# Patient Record
Sex: Male | Born: 1940 | Race: White | Hispanic: No | Smoking: Former smoker
Health system: Southern US, Community
[De-identification: ages and names within clinical notes are randomized; demographics above are authoritative.]

## PROBLEM LIST (undated history)

## (undated) DIAGNOSIS — R112 Nausea with vomiting, unspecified: Secondary | ICD-10-CM

## (undated) DIAGNOSIS — E119 Type 2 diabetes mellitus without complications: Secondary | ICD-10-CM

## (undated) DIAGNOSIS — Z87442 Personal history of urinary calculi: Secondary | ICD-10-CM

## (undated) DIAGNOSIS — F32A Depression, unspecified: Secondary | ICD-10-CM

## (undated) DIAGNOSIS — E0590 Thyrotoxicosis, unspecified without thyrotoxic crisis or storm: Secondary | ICD-10-CM

## (undated) DIAGNOSIS — E785 Hyperlipidemia, unspecified: Secondary | ICD-10-CM

## (undated) DIAGNOSIS — I10 Essential (primary) hypertension: Secondary | ICD-10-CM

## (undated) DIAGNOSIS — E039 Hypothyroidism, unspecified: Secondary | ICD-10-CM

## (undated) DIAGNOSIS — Z9889 Other specified postprocedural states: Secondary | ICD-10-CM

## (undated) DIAGNOSIS — F419 Anxiety disorder, unspecified: Secondary | ICD-10-CM

## (undated) DIAGNOSIS — H903 Sensorineural hearing loss, bilateral: Secondary | ICD-10-CM

## (undated) DIAGNOSIS — C801 Malignant (primary) neoplasm, unspecified: Secondary | ICD-10-CM

## (undated) HISTORY — DX: Essential (primary) hypertension: I10

## (undated) HISTORY — PX: MANDIBLE FRACTURE SURGERY: SHX706

## (undated) HISTORY — DX: Hyperlipidemia, unspecified: E78.5

---

## 2011-10-14 HISTORY — PX: HERNIA REPAIR: SHX51

## 2012-06-13 DEATH — deceased

## 2012-07-15 ENCOUNTER — Ambulatory Visit: Payer: Self-pay | Admitting: Surgery

## 2012-07-23 ENCOUNTER — Ambulatory Visit: Payer: Self-pay | Admitting: Surgery

## 2014-05-19 DIAGNOSIS — F418 Other specified anxiety disorders: Secondary | ICD-10-CM | POA: Insufficient documentation

## 2014-05-19 DIAGNOSIS — E7800 Pure hypercholesterolemia, unspecified: Secondary | ICD-10-CM | POA: Insufficient documentation

## 2014-05-19 DIAGNOSIS — I10 Essential (primary) hypertension: Secondary | ICD-10-CM | POA: Insufficient documentation

## 2014-05-19 DIAGNOSIS — R4589 Other symptoms and signs involving emotional state: Secondary | ICD-10-CM | POA: Insufficient documentation

## 2014-11-19 DIAGNOSIS — Z79899 Other long term (current) drug therapy: Secondary | ICD-10-CM | POA: Insufficient documentation

## 2014-11-21 DIAGNOSIS — I10 Essential (primary) hypertension: Secondary | ICD-10-CM | POA: Diagnosis not present

## 2014-11-21 DIAGNOSIS — R21 Rash and other nonspecific skin eruption: Secondary | ICD-10-CM | POA: Diagnosis not present

## 2014-11-21 DIAGNOSIS — E780 Pure hypercholesterolemia: Secondary | ICD-10-CM | POA: Diagnosis not present

## 2014-11-21 DIAGNOSIS — Z23 Encounter for immunization: Secondary | ICD-10-CM | POA: Diagnosis not present

## 2014-11-21 DIAGNOSIS — Z79899 Other long term (current) drug therapy: Secondary | ICD-10-CM | POA: Diagnosis not present

## 2015-01-30 NOTE — Op Note (Signed)
PATIENT NAME:  Lonnie Clarke, Lonnie Clarke MR#:  [id number] DATE OF BIRTH:  1940-11-07  DATE OF PROCEDURE:  07/23/2012  PREOPERATIVE DIAGNOSIS: Left inguinal hernia.   POSTOPERATIVE DIAGNOSIS: Left inguinal hernia.  PROCEDURE: Laparoscopic left inguinal hernia repair.   SURGEON: Rochel Brome, MD   ANESTHESIA: General.   INDICATIONS: This 74 year old has had prior left inguinal hernia repair in the distant past but recently has been having recurrent bulging in the left groin which is intermittent. He has been able to manipulate the hernia to push it back in. A left inguinal hernia was demonstrated on physical exam and repair was recommended.   DESCRIPTION OF PROCEDURE: The patient was placed on the operating table in the supine position under general endotracheal anesthesia. The abdomen was clipped and prepared with ChloraPrep and draped in a sterile manner.   A short incision was made in the inferior aspect of the umbilicus and carried down to the deep fascia, which was grasped with a laryngeal hook and elevated. A Veress needle was inserted, aspirated, and irrigated with a saline solution. Next, the peritoneal cavity was inflated with carbon dioxide. The Veress needle was removed. The 10 mm cannula was inserted. The 10 mm, 0 degree laparoscope was inserted to view the peritoneal cavity. Another incision was made in the left mid abdomen to introduce a 5 mm cannula. Another incision was made in the right mid abdomen to introduce a 5 mm cannula.   The patient was placed in the Trendelenburg position. I did note some suture material in the right, but there was no hernia seen on the right. There was, however, an indirect inguinal hernia found on the left. There was some omentum which is adherent to the anterior abdominal wall near the hernia. This was taken down with sharp dissection. The sigmoid colon was seen coursing just posterior to the hernia sac. An incision was made in the peritoneum just above the hernia.  This was some 5 inches in length, and a peritoneal flap was reflected posteriorly. The indirect hernia sac was dissected away from the internal ring and was retracted back away from the abdominal wall and further dissected away from cord structures. Circumferential dissection was carried out around the cord structures and around the inferior epigastric vessels. The Cooper's ligament was demonstrated. Next, Atrium mesh was cut to create an oval shape of some 3 x 5 inches with a posterior oblique slit and cruciate incision. This was inserted and passed around the inferior epigastric vessels and cord structures. The slit was overlapped and closed with spiral tacks. The spiral tacks were placed only into the mesh. Next, the mesh was attached to the Cooper's ligament, rectus muscle, and transversalis fascia with a row of tacks carried laterally to the iliopubic tract. The repair looked good. Hemostasis was intact. The portion of the hernia sac was tacked to the posterior aspect of the mesh. The peritoneum was closed over the mesh with a spiral tacker. Next, the cannula was removed and carbon dioxide was allowed to escape from the peritoneal cavity. The fascial defect below the umbilicus was closed with a 0 Vicryl simple suture. The skin incisions were closed with 5-0 chromic subcuticular suture and Dermabond. The patient tolerated surgery satisfactorily and was then prepared for transfer to the recovery room.   ____________________________ Lenna Sciara. Rochel Brome, MD [id number] D: 07/23/2012 09:06:44 ET T: 07/23/2012 10:45:46 ET JOB#: [id number]  cc: Loreli Dollar, MD, <Dictator> Loreli Dollar MD ELECTRONICALLY SIGNED 07/30/2012 18:53

## 2015-05-22 ENCOUNTER — Other Ambulatory Visit: Payer: Self-pay | Admitting: Internal Medicine

## 2015-05-22 DIAGNOSIS — E7800 Pure hypercholesterolemia, unspecified: Secondary | ICD-10-CM

## 2015-05-22 DIAGNOSIS — I10 Essential (primary) hypertension: Secondary | ICD-10-CM

## 2015-05-22 DIAGNOSIS — I6330 Cerebral infarction due to thrombosis of unspecified cerebral artery: Secondary | ICD-10-CM | POA: Diagnosis not present

## 2015-05-22 DIAGNOSIS — Z79899 Other long term (current) drug therapy: Secondary | ICD-10-CM | POA: Diagnosis not present

## 2015-05-29 ENCOUNTER — Ambulatory Visit
Admission: RE | Admit: 2015-05-29 | Discharge: 2015-05-29 | Disposition: A | Discharge status: Home / Self Care | Payer: Commercial Managed Care - HMO | Source: Ambulatory Visit | Attending: Internal Medicine | Admitting: Internal Medicine

## 2015-05-29 DIAGNOSIS — Z136 Encounter for screening for cardiovascular disorders: Secondary | ICD-10-CM | POA: Diagnosis not present

## 2015-05-29 DIAGNOSIS — N281 Cyst of kidney, acquired: Secondary | ICD-10-CM | POA: Diagnosis not present

## 2015-05-29 DIAGNOSIS — E7800 Pure hypercholesterolemia, unspecified: Secondary | ICD-10-CM

## 2015-05-29 DIAGNOSIS — I10 Essential (primary) hypertension: Secondary | ICD-10-CM | POA: Diagnosis not present

## 2015-05-29 DIAGNOSIS — I6330 Cerebral infarction due to thrombosis of unspecified cerebral artery: Secondary | ICD-10-CM

## 2015-05-29 IMAGING — US US RETROPERITONEAL COMPLETE
1 series · 14 of 25 positions shown · non-contrast
Comparison: None.

CLINICAL DATA: Screening for AAA

EXAM:
ULTRASOUND RETROPERITONEAL COMPLETE
TECHNIQUE: Ultrasound examination of the abdominal aorta was performed to
evaluate for abdominal aortic aneurysm. The common iliac arteries,
IVC, and kidneys were also evaluated.

[Series 1: us retroperitoneal complete · 0.27mm/px · 14 of 44 slices shown]
[im 1/44]
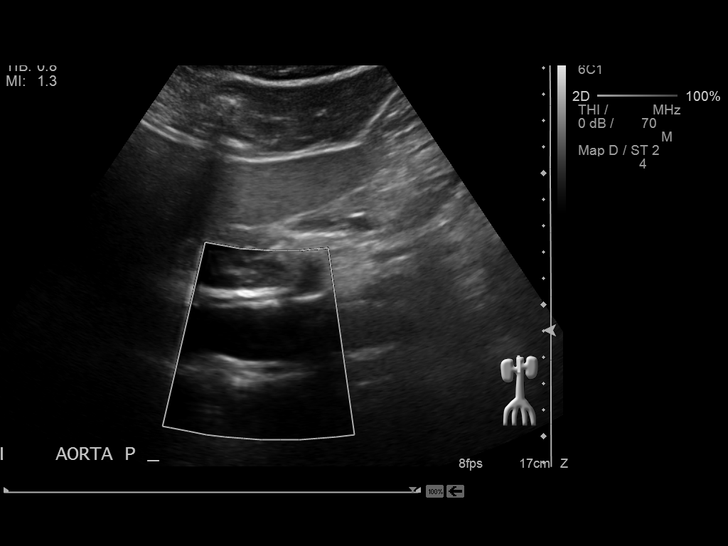
[im 4/44]
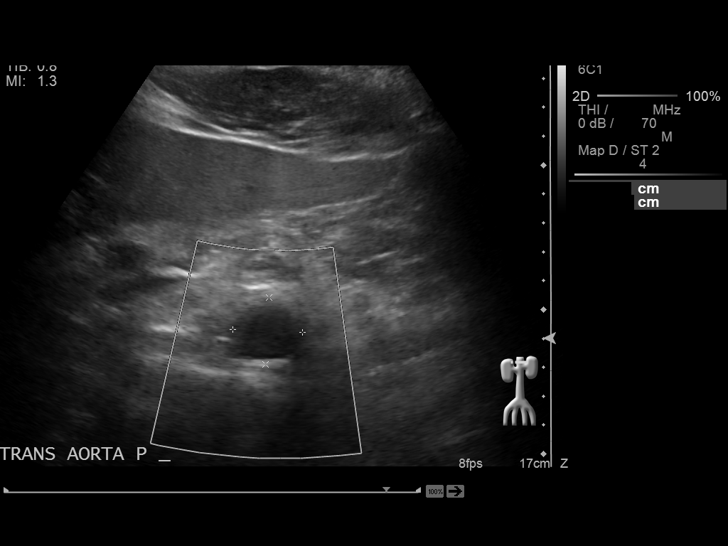
[im 8/44]
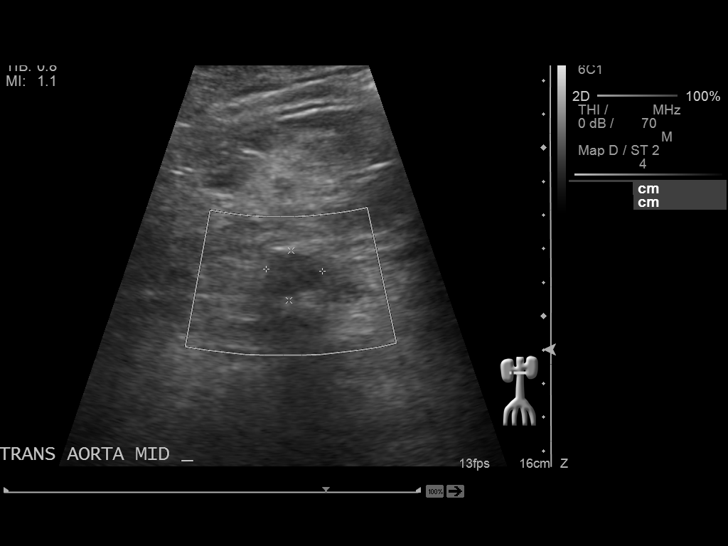
[im 11/44]
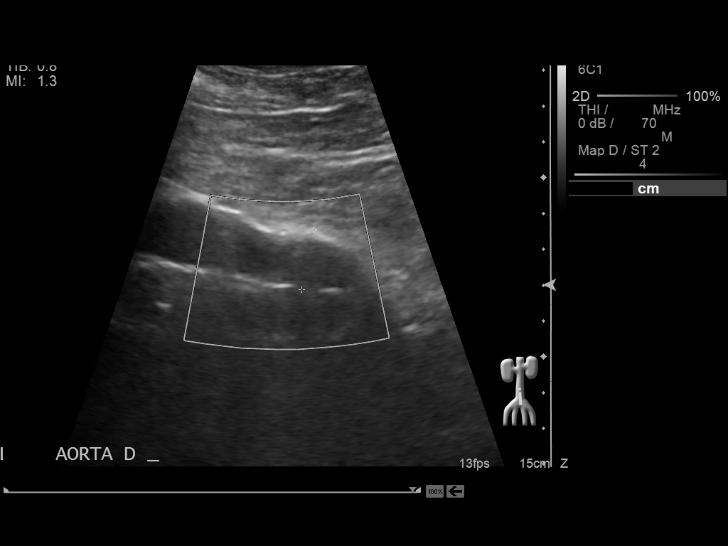
[im 15/44]
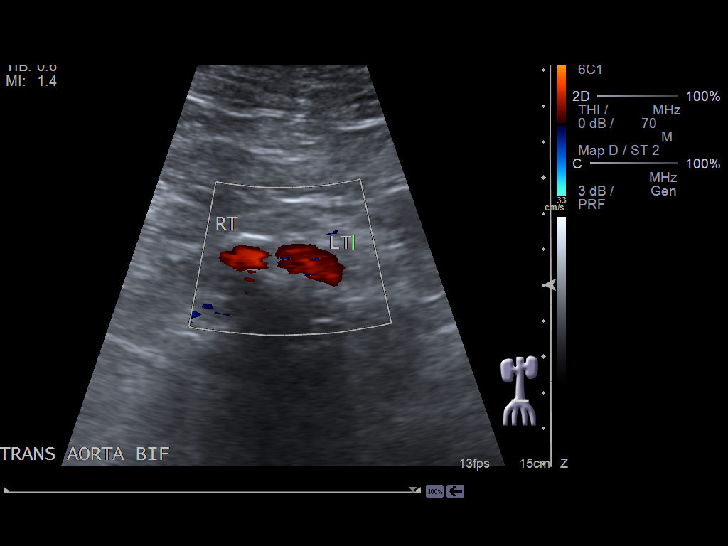
[im 17/44]
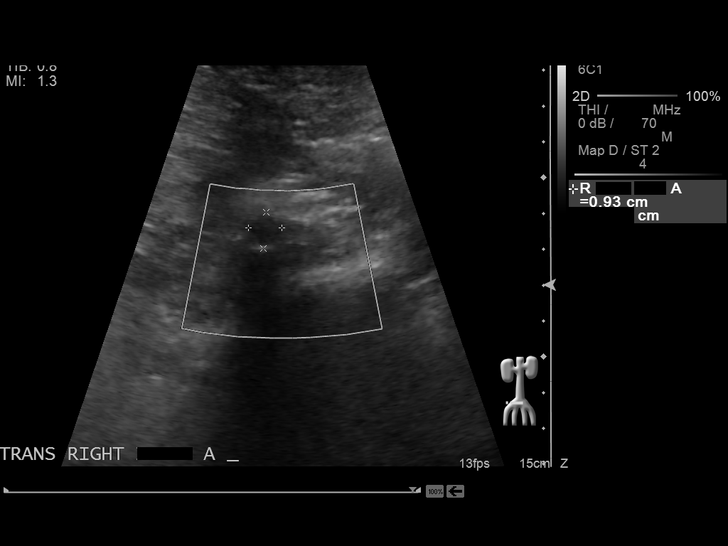
[im 20/44]
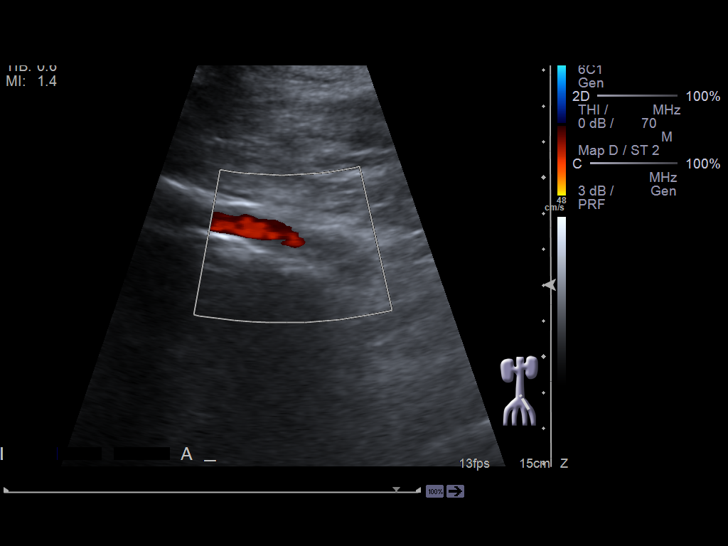
[im 24/44]
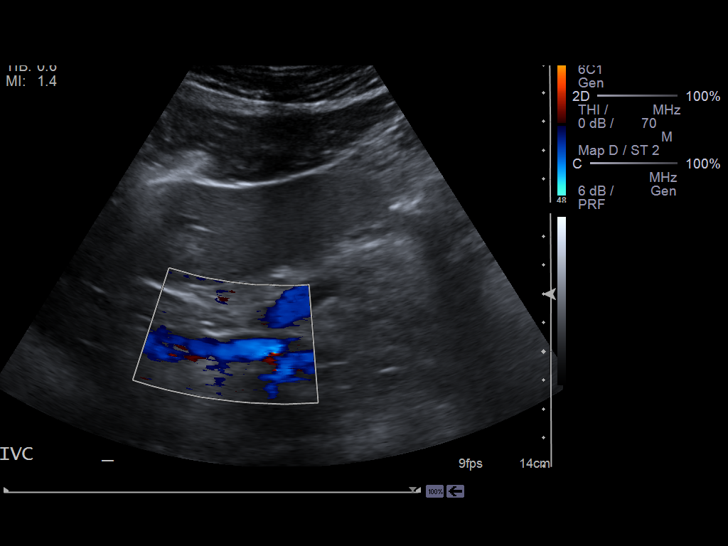
[im 27/44]
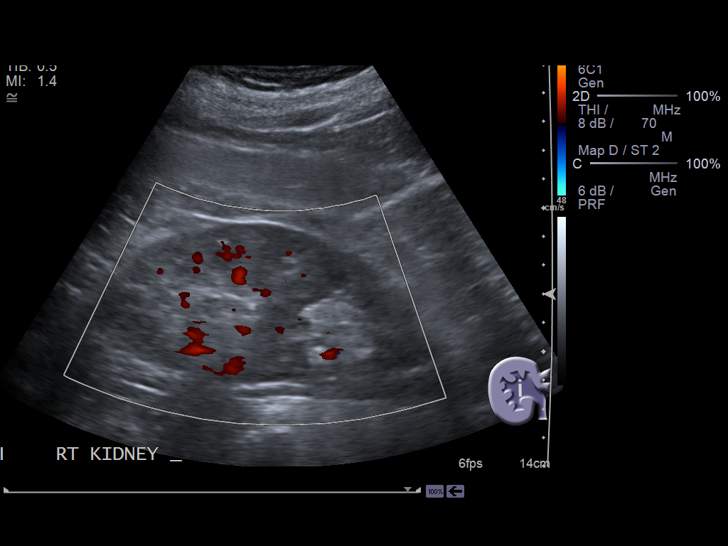
[im 29/44]
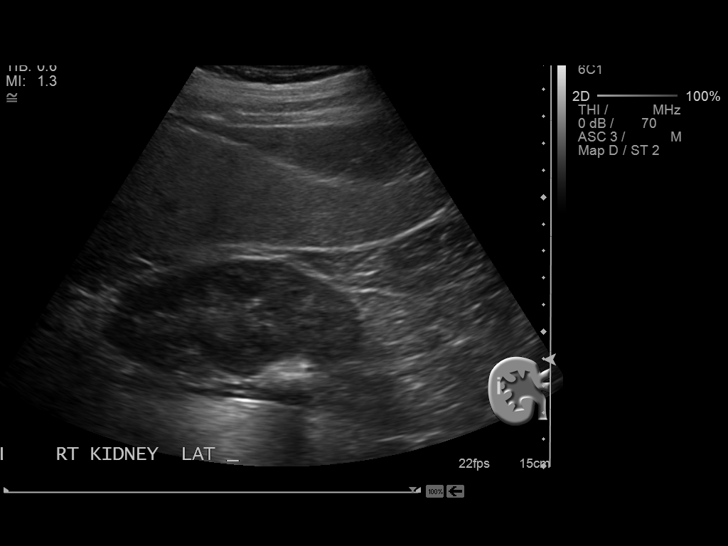
[im 33/44]
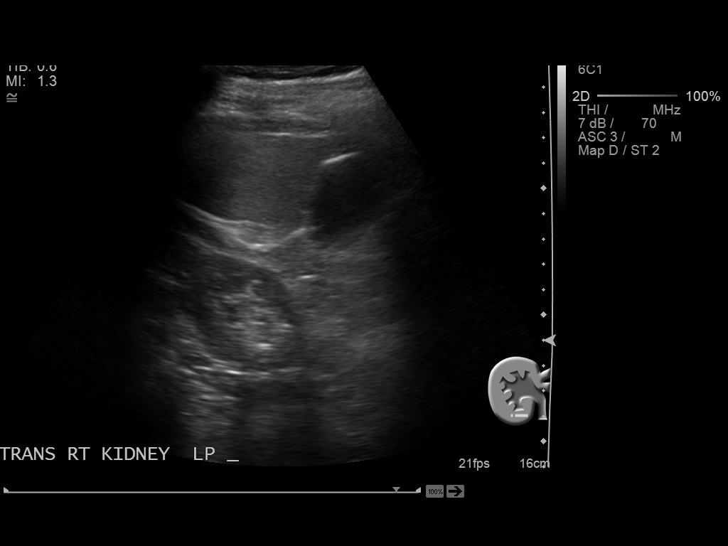
[im 36/44]
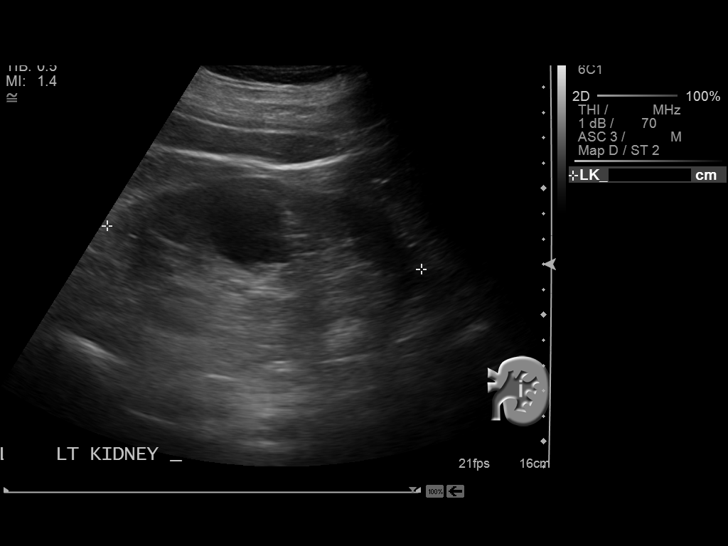
[im 40/44]
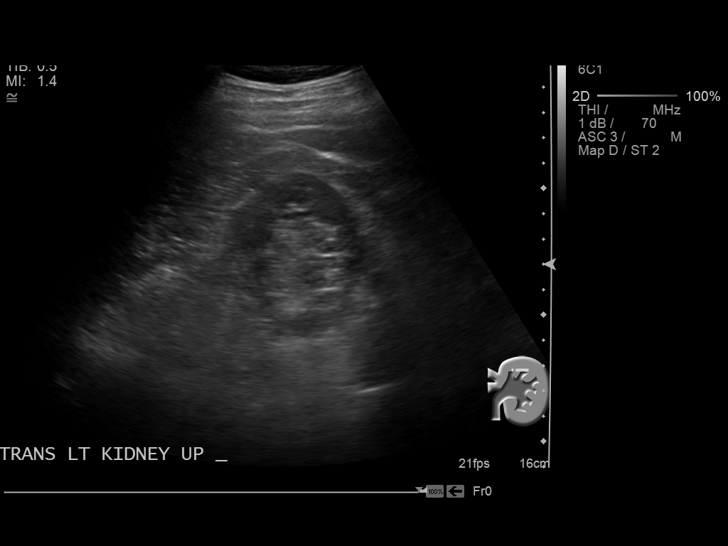
[im 44/44]
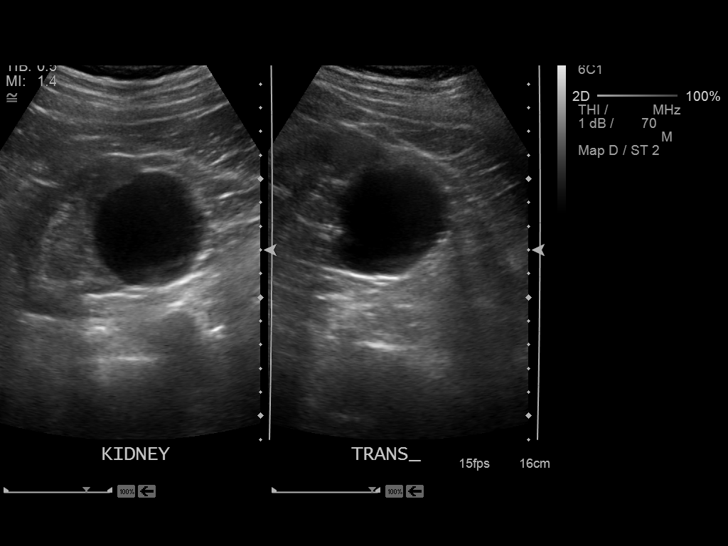

[14 of 25 positions shown; findings below may reference images not displayed]

FINDINGS: Abdominal Aorta

No aneurysm identified.

Maximum AP

Diameter:  2.4 cm

Maximum TRV

Diameter: 2.4 cm

Right Common Iliac Artery

1.0 cm, no aneurysm.

Left Common Iliac Artery

1.1 cm, no aneurysm.

IVC

No abnormality visualized.

Right Kidney

Length: 13.1 cm Echogenicity within normal limits. No mass or
hydronephrosis visualized.

Left Kidney

Length: 12.5 cm normal echotexture. No hydronephrosis. 5.0 cm simple
appearing lower pole cyst.
IMPRESSION: No evidence of abdominal aortic aneurysm.

5 cm simple appearing left lower pole renal cyst.

No hydronephrosis.

## 2015-08-29 DIAGNOSIS — R233 Spontaneous ecchymoses: Secondary | ICD-10-CM | POA: Diagnosis not present

## 2015-08-29 DIAGNOSIS — R739 Hyperglycemia, unspecified: Secondary | ICD-10-CM | POA: Diagnosis not present

## 2015-08-29 DIAGNOSIS — D699 Hemorrhagic condition, unspecified: Secondary | ICD-10-CM | POA: Diagnosis not present

## 2015-08-29 DIAGNOSIS — R42 Dizziness and giddiness: Secondary | ICD-10-CM | POA: Diagnosis not present

## 2015-08-29 DIAGNOSIS — I10 Essential (primary) hypertension: Secondary | ICD-10-CM | POA: Diagnosis not present

## 2015-11-23 DIAGNOSIS — H61302 Acquired stenosis of left external ear canal, unspecified: Secondary | ICD-10-CM | POA: Diagnosis not present

## 2015-11-23 DIAGNOSIS — E7800 Pure hypercholesterolemia, unspecified: Secondary | ICD-10-CM | POA: Diagnosis not present

## 2015-11-23 DIAGNOSIS — Z79899 Other long term (current) drug therapy: Secondary | ICD-10-CM | POA: Diagnosis not present

## 2015-11-23 DIAGNOSIS — E119 Type 2 diabetes mellitus without complications: Secondary | ICD-10-CM | POA: Insufficient documentation

## 2015-11-23 DIAGNOSIS — H6123 Impacted cerumen, bilateral: Secondary | ICD-10-CM | POA: Diagnosis not present

## 2015-11-23 DIAGNOSIS — I6330 Cerebral infarction due to thrombosis of unspecified cerebral artery: Secondary | ICD-10-CM | POA: Diagnosis not present

## 2015-11-23 DIAGNOSIS — I10 Essential (primary) hypertension: Secondary | ICD-10-CM | POA: Diagnosis not present

## 2015-11-29 ENCOUNTER — Encounter: Payer: Self-pay | Admitting: Internal Medicine

## 2015-12-10 ENCOUNTER — Encounter: Payer: Self-pay | Admitting: Urology

## 2015-12-10 ENCOUNTER — Ambulatory Visit (INDEPENDENT_AMBULATORY_CARE_PROVIDER_SITE_OTHER): Payer: Commercial Managed Care - HMO | Admitting: Urology

## 2015-12-10 VITALS — BP 158/82 | HR 69 | Ht 73.0 in | Wt 206.4 lb

## 2015-12-10 DIAGNOSIS — R3129 Other microscopic hematuria: Secondary | ICD-10-CM | POA: Insufficient documentation

## 2015-12-10 DIAGNOSIS — R310 Gross hematuria: Secondary | ICD-10-CM | POA: Diagnosis not present

## 2015-12-10 DIAGNOSIS — E785 Hyperlipidemia, unspecified: Secondary | ICD-10-CM | POA: Insufficient documentation

## 2015-12-10 DIAGNOSIS — I639 Cerebral infarction, unspecified: Secondary | ICD-10-CM | POA: Insufficient documentation

## 2015-12-10 NOTE — Progress Notes (Signed)
Consult: Microscopic hematuria  Referred by: Dr. Raechel Ache  History of present illness: 75 year old male who was recently seen by Dr. Raechel Ache when a urinalysis was shown to have 10-50 red blood cells per high-powered field. There was rare bacteria. Patient has had no dysuria or gross hematuria. In fact he has no lower urinary tract symptoms. He's had no history of flank pain or stone passage. 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   His blood pressure was mildly elevated today and he thought was related to change from 325 mg daily 1 mg of aspirin. I told him to follow-up with Dr. Raechel Ache to evaluate.   I reviewed the patient's past medical, past surgical history, family history, social history, medications, allergies. Significant findings noted.  13 system review of systems was obtained which was negative apart from the following: Sinus problems: Easy bruising: Back pain and joint pain  Physical exam: Patient in no acute distress. On neurologic exam and not palpate any inguinal hernias. The penis was uncircumcised and the foreskin was normal without mass or lesion. No phimosis. The testicles were descended bilaterally and palpably normal. On digital rectal exam the prostate was normal without induration or nodule.  Assessment/plan: Microscopic hematuria-I discussed with the patient the nature risks and benefits of CT scan with IV contrast and cystoscopy. He said it like to recheck the urine again before proceeding with cystoscopy but I cautioned him a negative urinalysis would not rule out something concerning in his bladder. I recommended cystoscopy. I sent a BUN and creatinine as well as a PSA today, he'll get a CT scan of follow-up to review and for cysto.

## 2015-12-11 ENCOUNTER — Telehealth: Payer: Self-pay

## 2015-12-11 DIAGNOSIS — R3129 Other microscopic hematuria: Secondary | ICD-10-CM

## 2015-12-11 LAB — URINALYSIS, COMPLETE
Bilirubin, UA: NEGATIVE
Glucose, UA: NEGATIVE
Ketones, UA: NEGATIVE
Leukocytes, UA: NEGATIVE
NITRITE UA: NEGATIVE
SPEC GRAV UA: 1.01 (ref 1.005–1.030)
Urobilinogen, Ur: 1 mg/dL (ref 0.2–1.0)
pH, UA: 6 (ref 5.0–7.5)

## 2015-12-11 LAB — BUN+CREAT
BUN / CREAT RATIO: 10 (ref 10–22)
BUN: 12 mg/dL (ref 8–27)
CREATININE: 1.15 mg/dL (ref 0.76–1.27)
GFR, EST AFRICAN AMERICAN: 73 mL/min/{1.73_m2} (ref >59)
GFR, EST NON AFRICAN AMERICAN: 63 mL/min/{1.73_m2} (ref >59)

## 2015-12-11 LAB — PSA: Prostate Specific Ag, Serum: 1.5 ng/mL (ref 0.0–4.0)

## 2015-12-11 LAB — MICROSCOPIC EXAMINATION
Bacteria, UA: NONE SEEN
Epithelial Cells (non renal): NONE SEEN /hpf (ref 0–10)
WBC, UA: NONE SEEN /hpf (ref 0–?)

## 2015-12-11 NOTE — Telephone Encounter (Signed)
-----   Message from Festus Aloe, MD sent at 12/11/2015 10:47 AM EST ----- Notify patient his UA shows continued blood (microscopic). Keep plan for CT and cystoscopy.   ----- Message -----    From: Lestine Box, LPN    Sent: [id number]   8:10 AM      To: Festus Aloe, MD    ----- Message -----    From: [facility] Lab Results In Interface    Sent: 12/11/2015   5:40 AM      To: Rowe Choua [facility]

## 2015-12-11 NOTE — Telephone Encounter (Signed)
Spoke with pt in reference to micro hematuria, CT, and cysto. Pt voiced understanding.

## 2015-12-17 ENCOUNTER — Ambulatory Visit
Admission: RE | Admit: 2015-12-17 | Discharge: 2015-12-17 | Disposition: A | Discharge status: Home / Self Care | Payer: Commercial Managed Care - HMO | Source: Ambulatory Visit | Attending: Urology | Admitting: Urology

## 2015-12-17 DIAGNOSIS — N281 Cyst of kidney, acquired: Secondary | ICD-10-CM | POA: Diagnosis not present

## 2015-12-17 DIAGNOSIS — K8020 Calculus of gallbladder without cholecystitis without obstruction: Secondary | ICD-10-CM | POA: Diagnosis not present

## 2015-12-17 DIAGNOSIS — I2510 Atherosclerotic heart disease of native coronary artery without angina pectoris: Secondary | ICD-10-CM | POA: Insufficient documentation

## 2015-12-17 DIAGNOSIS — I7090 Unspecified atherosclerosis: Secondary | ICD-10-CM | POA: Diagnosis not present

## 2015-12-17 DIAGNOSIS — R310 Gross hematuria: Secondary | ICD-10-CM

## 2015-12-17 MED ORDER — IOHEXOL 300 MG/ML  SOLN
125.0000 mL | Freq: Once | INTRAMUSCULAR | Status: AC | PRN
  Administered 2015-12-17: 125 mL via INTRAVENOUS

## 2015-12-18 DIAGNOSIS — R3129 Other microscopic hematuria: Secondary | ICD-10-CM | POA: Diagnosis not present

## 2015-12-19 ENCOUNTER — Telehealth: Payer: Self-pay

## 2015-12-19 NOTE — Telephone Encounter (Signed)
Spoke with pt in reference to CT results. Pt voiced understanding.  

## 2015-12-19 NOTE — Telephone Encounter (Signed)
-----   Message from Festus Aloe, MD sent at 12/19/2015  1:31 PM EST ----- Notify patient his CT scan was normal.  He may have a small left kidney stone.  Follow-up for cystoscopy as planned.   ----- Message -----    From: Lestine Box, LPN    Sent: [id number]  11:16 AM      To: Festus Aloe, MD    ----- Message -----    From: Rad Results In Interface    Sent: 12/18/2015  10:32 AM      To: Rowe Doroteo [facility]

## 2015-12-20 ENCOUNTER — Encounter: Payer: Self-pay | Admitting: Urology

## 2015-12-20 ENCOUNTER — Ambulatory Visit (INDEPENDENT_AMBULATORY_CARE_PROVIDER_SITE_OTHER): Payer: Commercial Managed Care - HMO | Admitting: Urology

## 2015-12-20 VITALS — BP 164/83 | HR 73 | Ht 73.0 in | Wt 208.0 lb

## 2015-12-20 DIAGNOSIS — R3129 Other microscopic hematuria: Secondary | ICD-10-CM

## 2015-12-20 LAB — URINALYSIS, COMPLETE
BILIRUBIN UA: NEGATIVE
Glucose, UA: NEGATIVE
Ketones, UA: NEGATIVE
Leukocytes, UA: NEGATIVE
Nitrite, UA: NEGATIVE
PH UA: 7 (ref 5.0–7.5)
Specific Gravity, UA: 1.015 (ref 1.005–1.030)
UUROB: 1 mg/dL (ref 0.2–1.0)

## 2015-12-20 LAB — MICROSCOPIC EXAMINATION
BACTERIA UA: NONE SEEN
EPITHELIAL CELLS (NON RENAL): NONE SEEN /HPF (ref 0–10)
RBC, UA: >30 /hpf — AB (ref 0–?)

## 2015-12-20 MED ORDER — LIDOCAINE HCL 2 % EX GEL
1.0000 "application " | Freq: Once | CUTANEOUS | Status: AC
  Administered 2015-12-20: 1 via URETHRAL

## 2015-12-20 MED ORDER — CIPROFLOXACIN HCL 500 MG PO TABS
500.0000 mg | ORAL_TABLET | Freq: Once | ORAL | Status: AC
  Administered 2015-12-20: 500 mg via ORAL

## 2015-12-20 NOTE — Progress Notes (Signed)
12/20/2015 3:46 PM   Lonnie Clarke [birth year] [id number]  Referring provider: Ezequiel Kayser, MD [place] [facility] [place], [place] [address]  Chief Complaint  Patient presents with  . Cysto    microhematuria    HPI: 75 year old male who was recently seen by Dr. Raechel Ache when a urinalysis was shown to have 10-50 red blood cells per high-powered field. There was rare bacteria. Patient has had no dysuria or gross hematuria. In fact he has no lower urinary tract symptoms. He's had no history of flank pain or stone passage. 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   Interval history:  The patient returns for completion of his microscopic hematuria workup. The CT scan was normal except for a possible 2-3 mm nonobstructing left renal stone. It also showed a left renal cyst. Cystoscopy was negative  PMH: Past Medical History  Diagnosis Date  . Hypertension   . Hyperlipidemia     Surgical History: Past Surgical History  Procedure Laterality Date  . Hernia repair  2013    Home Medications:    Medication List       This list is accurate as of: 12/20/15  3:46 PM.  Always use your most recent med list.               aspirin 81 MG chewable tablet  Chew by mouth.     hydrochlorothiazide 25 MG tablet  Commonly known as:  HYDRODIURIL  Take by mouth.     pravastatin 20 MG tablet  Commonly known as:  PRAVACHOL  Take by mouth.     quinapril 10 MG tablet  Commonly known as:  ACCUPRIL  Take by mouth.        Allergies:  Allergies  Allergen Reactions  . Amlodipine Other (See Comments)    Irritation of gums  . Metoprolol Other (See Comments)    fatigue  . Ramipril Nausea Only  . Triamterene-Hctz Other (See Comments)    weakness  . Valsartan Other (See Comments)    Irritation of lips    Family History: Family History  Problem Relation Age of Onset  . Prostate cancer Neg Hx   . Bladder  Cancer Neg Hx     Social History:  reports that he quit smoking about 16 years ago. He does not have any smokeless tobacco history on file. He reports that he drinks alcohol. He reports that he does not use illicit drugs.  ROS:                                        Physical Exam: BP 164/83 mmHg  Pulse 73  Ht 6\' 1"  (1.854 m)  Wt 208 lb (94.348 kg)  BMI 27.45 kg/m2  Constitutional:  Alert and oriented, No acute distress. HEENT: [place] AT, moist mucus membranes.  Trachea midline, no masses. Cardiovascular: No clubbing, cyanosis, or edema. Respiratory: Normal respiratory effort, no increased work of breathing. GI: Abdomen is soft, nontender, nondistended, no abdominal masses GU: No CVA tenderness.  Skin: No rashes, bruises or suspicious lesions. Lymph: No cervical or inguinal adenopathy. Neurologic: Grossly intact, no focal deficits, moving all 4 extremities. Psychiatric: Normal mood and affect.  Laboratory Data: No results found for: WBC, HGB, HCT, MCV, PLT  Lab Results  Component Value Date   CREATININE 1.15 12/10/2015  No results found for: PSA  No results found for: TESTOSTERONE  No results found for: HGBA1C  Urinalysis    Component Value Date/Time   GLUCOSEU Negative 12/10/2015 1539   BILIRUBINUR Negative 12/10/2015 1539   NITRITE Negative 12/10/2015 1539   LEUKOCYTESUR Negative 12/10/2015 1539    Pertinent Imaging: CLINICAL DATA: Microscopic hematuria. Inguinal hernia surgeries. History of basal cell skin cancer.  EXAM: CT ABDOMEN AND PELVIS WITHOUT AND WITH CONTRAST  TECHNIQUE: Multidetector CT imaging of the abdomen and pelvis was performed following the standard protocol before and following the bolus administration of intravenous contrast.  CONTRAST: 193mL OMNIPAQUE IOHEXOL 300 MG/ML SOLN  COMPARISON: 05/29/2015 aortic ultrasound.  FINDINGS: Lower chest: Centrilobular emphysema. Cardiomegaly, accentuated by  a pectus excavatum deformity. Multivessel coronary artery atherosclerosis.  Hepatobiliary: Normal liver. Small gallstones without acute cholecystitis or biliary duct dilatation.  Pancreas: Normal, without mass or ductal dilatation.  Spleen: Normal in size, without focal abnormality.  Adrenals/Urinary Tract: Normal adrenal glands. 4 mm interpolar left renal collecting system calculus suspected. Posterior interpolar right renal dystrophic calcifications. No hydronephrosis. No hydroureter or ureteric calculi. Contrast remains in the urinary bladder on the precontrast exam (the technologist inadvertently forgot to perform the precontrast study on 12/17/2015. Therefore, the patient was returned 24 hours later for attempted precontrast imaging.)  Left renal interpolar 5.5 cm cyst. No suspicious renal mass. Good renal collecting system opacification on delayed images. Portions of the ureters are suboptimally opacified on delayed images.  No enhancing bladder mass or bladder filling defect on delayed images.  Stomach/Bowel: Normal stomach, without wall thickening. Normal colon, appendix, and terminal ileum. Normal small bowel.  Vascular/Lymphatic: Advanced aortic and branch vessel atherosclerosis. No abdominopelvic adenopathy.  Reproductive: Normal prostate.  Other: Left inguinal hernia repair. No significant free fluid. Tiny left periumbilical ventral wall hernia contains fat.  Musculoskeletal: Moderate lumbar spondylosis.  IMPRESSION: 1. Probable left nephrolithiasis. Of note, the precontrast series was performed 24 hours after the postcontrast exam, as this was inadvertently not performed initially. Therefore, contrast remains in the urinary bladder. The apparent stone could therefore represent trace residual left renal collecting system contrast. 2. Otherwise, no explanation for hematuria. 3. Atherosclerosis, including within the coronary arteries. 4.  Cholelithiasis. 5. Left renal cyst.   Cystoscopy Procedure Note  Patient identification was confirmed, informed consent was obtained, and patient was prepped using Betadine solution.  Lidocaine jelly was administered per urethral meatus.    Preoperative abx where received prior to procedure.     Pre-Procedure: - Inspection reveals a normal caliber ureteral meatus.  Procedure: The flexible cystoscope was introduced without difficulty - No urethral strictures/lesions are present. - Enlarged prostate  - Normal bladder neck - Bilateral ureteral orifices identified - Bladder mucosa  reveals no ulcers, tumors, or lesions - No bladder stones - No trabeculation  Retroflexion shows small intravesical lobe   Post-Procedure: - Patient tolerated the procedure well  Assessment & Plan:    1. Microscopic hematuria Follow up in one year with repeat urinalysis  2. Possible small nonobstructive calculus left renal calculus No further treatment necessary at this time.   Return in about 1 year (around 12/19/2016).  Nickie Retort, MD  [facility] [address], [place], [place] [address] [phone]

## 2016-01-11 DIAGNOSIS — H90A22 Sensorineural hearing loss, unilateral, left ear, with restricted hearing on the contralateral side: Secondary | ICD-10-CM | POA: Diagnosis not present

## 2016-01-11 DIAGNOSIS — H6123 Impacted cerumen, bilateral: Secondary | ICD-10-CM | POA: Diagnosis not present

## 2016-05-22 DIAGNOSIS — L989 Disorder of the skin and subcutaneous tissue, unspecified: Secondary | ICD-10-CM | POA: Diagnosis not present

## 2016-05-22 DIAGNOSIS — E7800 Pure hypercholesterolemia, unspecified: Secondary | ICD-10-CM | POA: Diagnosis not present

## 2016-05-22 DIAGNOSIS — Z87442 Personal history of urinary calculi: Secondary | ICD-10-CM | POA: Diagnosis not present

## 2016-05-22 DIAGNOSIS — E119 Type 2 diabetes mellitus without complications: Secondary | ICD-10-CM | POA: Diagnosis not present

## 2016-05-22 DIAGNOSIS — Z79899 Other long term (current) drug therapy: Secondary | ICD-10-CM | POA: Diagnosis not present

## 2016-05-22 DIAGNOSIS — Z23 Encounter for immunization: Secondary | ICD-10-CM | POA: Diagnosis not present

## 2016-06-06 DIAGNOSIS — H6123 Impacted cerumen, bilateral: Secondary | ICD-10-CM | POA: Diagnosis not present

## 2016-06-06 DIAGNOSIS — Z1283 Encounter for screening for malignant neoplasm of skin: Secondary | ICD-10-CM | POA: Diagnosis not present

## 2016-06-26 DIAGNOSIS — C44212 Basal cell carcinoma of skin of right ear and external auricular canal: Secondary | ICD-10-CM | POA: Diagnosis not present

## 2016-06-26 DIAGNOSIS — C44202 Unspecified malignant neoplasm of skin of right ear and external auricular canal: Secondary | ICD-10-CM | POA: Diagnosis not present

## 2016-06-26 DIAGNOSIS — Z1283 Encounter for screening for malignant neoplasm of skin: Secondary | ICD-10-CM | POA: Diagnosis not present

## 2016-11-18 DIAGNOSIS — E119 Type 2 diabetes mellitus without complications: Secondary | ICD-10-CM | POA: Diagnosis not present

## 2016-11-18 DIAGNOSIS — I10 Essential (primary) hypertension: Secondary | ICD-10-CM | POA: Diagnosis not present

## 2016-11-18 DIAGNOSIS — Z23 Encounter for immunization: Secondary | ICD-10-CM | POA: Diagnosis not present

## 2016-11-18 DIAGNOSIS — Z79899 Other long term (current) drug therapy: Secondary | ICD-10-CM | POA: Diagnosis not present

## 2016-11-18 DIAGNOSIS — L989 Disorder of the skin and subcutaneous tissue, unspecified: Secondary | ICD-10-CM | POA: Diagnosis not present

## 2016-11-18 DIAGNOSIS — E7800 Pure hypercholesterolemia, unspecified: Secondary | ICD-10-CM | POA: Diagnosis not present

## 2016-12-19 ENCOUNTER — Ambulatory Visit: Payer: Commercial Managed Care - HMO

## 2016-12-24 ENCOUNTER — Encounter: Payer: Self-pay | Admitting: Urology

## 2016-12-24 ENCOUNTER — Ambulatory Visit: Payer: Medicare HMO | Admitting: Urology

## 2016-12-24 VITALS — BP 138/83 | HR 66 | Ht 73.0 in | Wt 204.3 lb

## 2016-12-24 DIAGNOSIS — R3129 Other microscopic hematuria: Secondary | ICD-10-CM

## 2016-12-24 LAB — MICROSCOPIC EXAMINATION
Bacteria, UA: NONE SEEN
Epithelial Cells (non renal): NONE SEEN /hpf (ref 0–10)
WBC, UA: NONE SEEN /hpf (ref 0–?)

## 2016-12-24 LAB — URINALYSIS, COMPLETE
Bilirubin, UA: NEGATIVE
GLUCOSE, UA: NEGATIVE
Ketones, UA: NEGATIVE
LEUKOCYTES UA: NEGATIVE
NITRITE UA: NEGATIVE
Protein, UA: NEGATIVE
Specific Gravity, UA: 1.01 (ref 1.005–1.030)
Urobilinogen, Ur: 0.2 mg/dL (ref 0.2–1.0)
pH, UA: 5.5 (ref 5.0–7.5)

## 2016-12-24 NOTE — Progress Notes (Signed)
12/24/2016 2:47 PM   Cletis Media 01-15-1941 [id number]  Referring provider: Ezequiel Kayser, MD [place] [facility] [place], [place] [address]  No chief complaint on file.   HPI:  F/u - MH 76 year old male with 10-50 red blood cells per high-powered field in 2017. No LUTS. 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   CT was negative 12/18/2015 and cystoscopy was negative 12/20/2015.  Today, patient seen for the above. UA clear today. No gross hematuria. No lower urinary symptoms. No flank pain or stone passage.   PMH: Past Medical History:  Diagnosis Date  . Hyperlipidemia   . Hypertension     Surgical History: Past Surgical History:  Procedure Laterality Date  . HERNIA REPAIR  2013    Home Medications:  Allergies as of 12/24/2016      Reactions   Amlodipine Other (See Comments)   Irritation of gums   Metoprolol Other (See Comments)   fatigue   Ramipril Nausea Only   Triamterene-hctz Other (See Comments)   weakness   Valsartan Other (See Comments)   Irritation of lips      Medication List       Accurate as of 12/24/16  2:47 PM. Always use your most recent med list.          aspirin 81 MG chewable tablet Chew by mouth.   hydrochlorothiazide 25 MG tablet Commonly known as:  HYDRODIURIL Take by mouth.   pravastatin 20 MG tablet Commonly known as:  PRAVACHOL Take by mouth.   quinapril 10 MG tablet Commonly known as:  ACCUPRIL Take by mouth.       Allergies:  Allergies  Allergen Reactions  . Amlodipine Other (See Comments)    Irritation of gums  . Metoprolol Other (See Comments)    fatigue  . Ramipril Nausea Only  . Triamterene-Hctz Other (See Comments)    weakness  . Valsartan Other (See Comments)    Irritation of lips    Family History: Family History  Problem Relation Age of Onset  . Prostate cancer Neg Hx   . Bladder Cancer Neg Hx     Social History:   reports that he quit smoking about 17 years ago. He does not have any smokeless tobacco history on file. He reports that he drinks alcohol. He reports that he does not use drugs.  ROS:                                        Physical Exam: There were no vitals taken for this visit.  Constitutional:  Alert and oriented, No acute distress. HEENT: [place] AT, moist mucus membranes.  Trachea midline, no masses. Cardiovascular: No clubbing, cyanosis, or edema. Respiratory: Normal respiratory effort, no increased work of breathing. GI: Abdomen is soft, nontender, nondistended, no abdominal masses GU: No CVA tenderness. DRE - 40 grams, smooth, no hard area or nodules  Skin: No rashes, bruises or suspicious lesions. Lymph: No cervical or inguinal adenopathy. Neurologic: Grossly intact, no focal deficits, moving all 4 extremities. Psychiatric: Normal mood and affect.  Laboratory Data: No results found for: WBC, HGB, HCT, MCV, PLT  Lab Results  Component Value Date   CREATININE 1.15 12/10/2015    No results found for: PSA  No results found for: TESTOSTERONE  No results found for: HGBA1C  Urinalysis  Component Value Date/Time   APPEARANCEUR Cloudy (A) 12/20/2015 1525   GLUCOSEU Negative 12/20/2015 1525   BILIRUBINUR Negative 12/20/2015 1525   PROTEINUR 1+ (A) 12/20/2015 1525   NITRITE Negative 12/20/2015 1525   LEUKOCYTESUR Negative 12/20/2015 1525    Pertinent Imaging: CT and u/s   Assessment & Plan:    MH - resolved. Screen one more time next year per AUA Guidelines. Normal ua and exam today.   There are no diagnoses linked to this encounter.  No Follow-up on file.  Festus Aloe, [facility] [address], [place], [place] [address] [phone]

## 2017-03-11 DIAGNOSIS — C44319 Basal cell carcinoma of skin of other parts of face: Secondary | ICD-10-CM | POA: Diagnosis not present

## 2017-03-11 DIAGNOSIS — D485 Neoplasm of uncertain behavior of skin: Secondary | ICD-10-CM | POA: Diagnosis not present

## 2017-03-11 DIAGNOSIS — L570 Actinic keratosis: Secondary | ICD-10-CM | POA: Diagnosis not present

## 2017-04-06 DIAGNOSIS — C4491 Basal cell carcinoma of skin, unspecified: Secondary | ICD-10-CM | POA: Diagnosis not present

## 2017-04-06 DIAGNOSIS — C44319 Basal cell carcinoma of skin of other parts of face: Secondary | ICD-10-CM | POA: Diagnosis not present

## 2017-05-21 DIAGNOSIS — Z79899 Other long term (current) drug therapy: Secondary | ICD-10-CM | POA: Diagnosis not present

## 2017-05-21 DIAGNOSIS — N183 Chronic kidney disease, stage 3 (moderate): Secondary | ICD-10-CM | POA: Diagnosis not present

## 2017-05-21 DIAGNOSIS — Z23 Encounter for immunization: Secondary | ICD-10-CM | POA: Diagnosis not present

## 2017-05-21 DIAGNOSIS — E1122 Type 2 diabetes mellitus with diabetic chronic kidney disease: Secondary | ICD-10-CM | POA: Diagnosis not present

## 2017-05-21 DIAGNOSIS — I739 Peripheral vascular disease, unspecified: Secondary | ICD-10-CM | POA: Diagnosis not present

## 2017-05-21 DIAGNOSIS — E7800 Pure hypercholesterolemia, unspecified: Secondary | ICD-10-CM | POA: Diagnosis not present

## 2017-05-21 DIAGNOSIS — I10 Essential (primary) hypertension: Secondary | ICD-10-CM | POA: Diagnosis not present

## 2017-05-21 DIAGNOSIS — E119 Type 2 diabetes mellitus without complications: Secondary | ICD-10-CM | POA: Diagnosis not present

## 2017-07-06 ENCOUNTER — Encounter (INDEPENDENT_AMBULATORY_CARE_PROVIDER_SITE_OTHER): Payer: Self-pay | Admitting: Vascular Surgery

## 2017-07-06 ENCOUNTER — Ambulatory Visit (INDEPENDENT_AMBULATORY_CARE_PROVIDER_SITE_OTHER): Payer: Commercial Managed Care - HMO | Admitting: Vascular Surgery

## 2017-07-06 VITALS — BP 152/73 | HR 64 | Resp 17 | Ht 73.0 in | Wt 203.8 lb

## 2017-07-06 DIAGNOSIS — I10 Essential (primary) hypertension: Secondary | ICD-10-CM

## 2017-07-06 DIAGNOSIS — I70213 Atherosclerosis of native arteries of extremities with intermittent claudication, bilateral legs: Secondary | ICD-10-CM | POA: Diagnosis not present

## 2017-07-06 DIAGNOSIS — E119 Type 2 diabetes mellitus without complications: Secondary | ICD-10-CM | POA: Diagnosis not present

## 2017-07-06 DIAGNOSIS — E782 Mixed hyperlipidemia: Secondary | ICD-10-CM

## 2017-07-07 DIAGNOSIS — I70219 Atherosclerosis of native arteries of extremities with intermittent claudication, unspecified extremity: Secondary | ICD-10-CM | POA: Insufficient documentation

## 2017-07-07 NOTE — Progress Notes (Signed)
MRN : [id number]  Lonnie Clarke is a 76 y.o. (1941-06-29) male who presents with chief complaint of  Chief Complaint  Patient presents with  . New Patient (Initial Visit)    bil claudication  .  History of Present Illness:  The patient is seen for evaluation of painful lower extremities and diminished pulses. Patient notes the pain is always associated with activity and is very consistent day today. Typically, the pain occurs at less than one block, progress is as activity continues to the point that the patient must stop walking. Resting including standing still for several minutes allowed resumption of the activity and the ability to walk a similar distance before stopping again. Uneven terrain and inclined shorten the distance. The pain has been progressive over the past several years. The patient states the inability to walk is now having a profound negative impact on quality of life and daily activities.  The patient denies rest pain or dangling of an extremity off the side of the bed during the night for relief. No open wounds or sores at this time. No prior interventions or surgeries.  No history of back problems or DJD of the lumbar sacral spine.   The patient denies changes in claudication symptoms or new rest pain symptoms.  No new ulcers or wounds of the foot.  The patient's blood pressure has been stable and relatively well controlled. The patient denies amaurosis fugax or recent TIA symptoms. There are no recent neurological changes noted. The patient denies history of DVT, PE or superficial thrombophlebitis. The patient denies recent episodes of angina or shortness of breath.   Current Meds  Medication Sig  . aspirin 81 MG chewable tablet Chew by mouth.  . hydrochlorothiazide (HYDRODIURIL) 25 MG tablet Take by mouth.  . pravastatin (PRAVACHOL) 20 MG tablet Take by mouth.  . quinapril (ACCUPRIL) 10 MG tablet Take by mouth.    Past Medical History:  Diagnosis Date    . Hyperlipidemia   . Hypertension     Past Surgical History:  Procedure Laterality Date  . HERNIA REPAIR  2013    Social History Social History  Substance Use Topics  . Smoking status: Former Smoker    Quit date: 12/10/1999  . Smokeless tobacco: Never Used  . Alcohol use 0.0 oz/week    Family History Family History  Problem Relation Age of Onset  . Prostate cancer Neg Hx   . Bladder Cancer Neg Hx   No family history of bleeding/clotting disorders, porphyria or autoimmune disease   Allergies  Allergen Reactions  . Amlodipine Other (See Comments)    Irritation of gums  . Metoprolol Other (See Comments)    fatigue  . Ramipril Nausea Only  . Triamterene-Hctz Other (See Comments)    weakness  . Valsartan Other (See Comments)    Irritation of lips     REVIEW OF SYSTEMS (Negative unless checked)  Constitutional: [] Weight loss  [] Fever  [] Chills Cardiac: [] Chest pain   [] Chest pressure   [] Palpitations   [] Shortness of breath when laying flat   [] Shortness of breath with exertion. Vascular:  [x] Pain in legs with walking   [] Pain in legs at rest  [] History of DVT   [] Phlebitis   [] Swelling in legs   [] Varicose veins   [] Non-healing ulcers Pulmonary:   [] Uses home oxygen   [] Productive cough   [] Hemoptysis   [] Wheeze  [] COPD   [] Asthma Neurologic:  [] Dizziness   [] Seizures   [] History of stroke   []   History of TIA  [] Aphasia   [] Vissual changes   [] Weakness or numbness in arm   [] Weakness or numbness in leg Musculoskeletal:   [] Joint swelling   [] Joint pain   [] Low back pain Hematologic:  [] Easy bruising  [] Easy bleeding   [] Hypercoagulable state   [] Anemic Gastrointestinal:  [] Diarrhea   [] Vomiting  [] Gastroesophageal reflux/heartburn   [] Difficulty swallowing. Genitourinary:  [] Chronic kidney disease   [] Difficult urination  [] Frequent urination   [] Blood in urine Skin:  [] Rashes   [] Ulcers  Psychological:  [] History of anxiety   []  History of major depression.  Physical  Examination  Vitals:   07/06/17 1332  BP: (!) 152/73  Pulse: 64  Resp: 17  Weight: 92.4 kg (203 lb 12.8 oz)  Height: 6\' 1"  (1.854 m)   Body mass index is 26.89 kg/m. Gen: WD/WN, NAD Head: /AT, No temporalis wasting.  Ear/Nose/Throat: Hearing grossly intact, nares w/o erythema or drainage, poor dentition Eyes: PER, EOMI, sclera nonicteric.  Neck: Supple, no masses.  No bruit or JVD.  Pulmonary:  Good air movement, clear to auscultation bilaterally, no use of accessory muscles.  Cardiac: RRR, normal S1, S2, no Murmurs. Vascular: no carotid bruits auscultated; 1-2+ edema bilateral ankles Vessel Right Left  Radial Palpable Palpable  Ulnar Palpable Palpable  Brachial Palpable Palpable  Carotid Palpable Palpable  Femoral Trace Palpable Trace Palpable  Popliteal Not Palpable Not Palpable  PT Not Palpable Not Palpable  DP Not Palpable Not Palpable  Gastrointestinal: soft, non-distended. No guarding/no peritoneal signs.  Musculoskeletal: M/S 5/5 throughout.  No deformity or atrophy.  Neurologic: CN 2-12 intact. Pain and light touch intact in extremities.  Symmetrical.  Speech is fluent. Motor exam as listed above. Psychiatric: Judgment intact, Mood & affect appropriate for pt's clinical situation. Dermatologic: No rashes or ulcers noted.  No changes consistent with cellulitis. Lymph : No Cervical lymphadenopathy, no lichenification or skin changes of chronic lymphedema.  CBC No results found for: WBC, HGB, HCT, MCV, PLT  BMET    Component Value Date/Time   BUN 12 12/10/2015 1517   CREATININE 1.15 12/10/2015 1517   GFRNONAA 63 12/10/2015 1517   GFRAA 73 12/10/2015 1517   CrCl cannot be calculated (Patient's most recent lab result is older than the maximum 21 days allowed.).  COAG No results found for: INR, PROTIME  Radiology No results found.  Assessment/Plan 1. Atherosclerosis of native artery of both lower extremities with intermittent claudication (HCC)   Recommend:  The patient has pain symptoms suggesting atherosclerotic disease. Given the diminished pulses.    Noninvasive studies including ultrasounds of the legs will be obtained and the patient will follow up with me to review these studies.  The patient should continue walking and begin a more formal exercise program. The patient should continue his antiplatelet therapy and aggressive treatment of the lipid abnormalities.  The patient should begin wearing graduated compression socks 15-20 mmHg strength to control edema.   - VAS US AORTA/IVC/ILIACS; Future - VAS [place] LOWER EXTREMITY ARTERIAL DUPLEX; Future  2. Benign essential HTN Continue antihypertensive medications as already ordered, these medications have been reviewed and there are no changes at this time.   3. Mixed hyperlipidemia Continue statin as ordered and reviewed, no changes at this time   4. Controlled type 2 diabetes mellitus without complication, without long-term current use of insulin (HCC) Continue hypoglycemic medications as already ordered, these medications have been reviewed and there are no changes at this time.  Hgb A1C to be monitored as  already arranged by primary service     Hortencia Pilar, MD  07/07/2017 4:17 PM

## 2017-09-07 ENCOUNTER — Ambulatory Visit (INDEPENDENT_AMBULATORY_CARE_PROVIDER_SITE_OTHER): Payer: Medicare HMO

## 2017-09-07 ENCOUNTER — Encounter (INDEPENDENT_AMBULATORY_CARE_PROVIDER_SITE_OTHER): Payer: Self-pay | Admitting: Vascular Surgery

## 2017-09-07 ENCOUNTER — Ambulatory Visit (INDEPENDENT_AMBULATORY_CARE_PROVIDER_SITE_OTHER): Payer: Medicare HMO | Admitting: Vascular Surgery

## 2017-09-07 VITALS — BP 132/76 | HR 59 | Resp 17 | Ht 73.0 in | Wt 203.0 lb

## 2017-09-07 DIAGNOSIS — I639 Cerebral infarction, unspecified: Secondary | ICD-10-CM

## 2017-09-07 DIAGNOSIS — I70213 Atherosclerosis of native arteries of extremities with intermittent claudication, bilateral legs: Secondary | ICD-10-CM

## 2017-09-07 DIAGNOSIS — E782 Mixed hyperlipidemia: Secondary | ICD-10-CM

## 2017-09-07 DIAGNOSIS — I10 Essential (primary) hypertension: Secondary | ICD-10-CM

## 2017-09-07 DIAGNOSIS — E119 Type 2 diabetes mellitus without complications: Secondary | ICD-10-CM

## 2017-09-07 DIAGNOSIS — M79606 Pain in leg, unspecified: Secondary | ICD-10-CM | POA: Insufficient documentation

## 2017-09-07 DIAGNOSIS — M79604 Pain in right leg: Secondary | ICD-10-CM

## 2017-09-07 DIAGNOSIS — M79605 Pain in left leg: Secondary | ICD-10-CM | POA: Diagnosis not present

## 2017-09-07 NOTE — Progress Notes (Signed)
MRN : [id number]  Lonnie Clarke is a 76 y.o. (17-Jun-1941) male who presents with chief complaint of No chief complaint on file. Lonnie Clarke  History of Present Illness: The patient returns to the office for followup and review of the noninvasive studies. There have been no interval changes in lower extremity symptoms. No interval shortening of the patient's claudication distance or development of rest pain symptoms. No new ulcers or wounds have occurred since the last visit.  There have been no significant changes to the patient's overall health care.  The patient denies amaurosis fugax or recent TIA symptoms. There are no recent neurological changes noted. The patient denies history of DVT, PE or superficial thrombophlebitis. The patient denies recent episodes of angina or shortness of breath.    No outpatient medications have been marked as taking for the 09/07/17 encounter (Appointment) with Lonnie Clarke, Lonnie Lory, MD.    Past Medical History:  Diagnosis Date  . Hyperlipidemia   . Hypertension       Social History Social History   Tobacco Use  . Smoking status: Former Smoker    Last attempt to quit: 12/10/1999    Years since quitting: 17.7  . Smokeless tobacco: Never Used  Substance Use Topics  . Alcohol use: Yes    Alcohol/week: 0.0 oz  . Drug use: No    Family History Family History  Problem Relation Age of Onset  . Prostate cancer Neg Hx   . Bladder Cancer Neg Hx     Allergies  Allergen Reactions  . Amlodipine Other (See Comments)    Irritation of gums  . Metoprolol Other (See Comments)    fatigue  . Ramipril Nausea Only  . Triamterene-Hctz Other (See Comments)    weakness  . Valsartan Other (See Comments)    Irritation of lips     REVIEW OF SYSTEMS (Negative unless checked)  Constitutional: [] Weight loss  [] Fever  [] Chills Cardiac: [] Chest pain   [] Chest pressure   [] Palpitations   [] Shortness of breath when laying flat   [] Shortness of breath with  exertion. Vascular:  [x] Pain in legs with walking   [] Pain in legs at rest  [] History of DVT   [] Phlebitis   [] Swelling in legs   [] Varicose veins   [] Non-healing ulcers Pulmonary:   [] Uses home oxygen   [] Productive cough   [] Hemoptysis   [] Wheeze  [] COPD   [] Asthma Neurologic:  [] Dizziness   [] Seizures   [] History of stroke   [] History of TIA  [] Aphasia   [] Vissual changes   [] Weakness or numbness in arm   [] Weakness or numbness in leg Musculoskeletal:   [] Joint swelling   [x] Joint pain   [x] Low back pain Hematologic:  [] Easy bruising  [] Easy bleeding   [] Hypercoagulable state   [] Anemic Gastrointestinal:  [] Diarrhea   [] Vomiting  [] Gastroesophageal reflux/heartburn   [] Difficulty swallowing. Genitourinary:  [] Chronic kidney disease   [] Difficult urination  [] Frequent urination   [] Blood in urine Skin:  [] Rashes   [] Ulcers  Psychological:  [] History of anxiety   []  History of major depression.  Physical Examination  There were no vitals filed for this visit. There is no height or weight on file to calculate BMI. Gen: WD/WN, NAD Head: [place]/AT, No temporalis wasting.  Ear/Nose/Throat: Hearing grossly intact, nares w/o erythema or drainage Eyes: PER, EOMI, sclera nonicteric.  Neck: Supple, no large masses.   Pulmonary:  Good air movement, no audible wheezing bilaterally, no use of accessory muscles.  Cardiac: RRR, no JVD Vascular:  Vessel Right Left  Radial Palpable Palpable  Brachial Palpable Palpable  Femoral Trace Palpable Trace Palpable  Popliteal Trace  Palpable Trace  Palpable  PT Trace  Palpable Trace  Palpable  DP Trace  Palpable Trace  Palpable  Gastrointestinal: Non-distended. No guarding/no peritoneal signs.  Musculoskeletal: M/S 5/5 throughout.  No deformity or atrophy.  Neurologic: CN 2-12 intact. Symmetrical.  Speech is fluent. Motor exam as listed above. Psychiatric: Judgment intact, Mood & affect appropriate for pt's clinical situation. Dermatologic: No rashes or ulcers  noted.  No changes consistent with cellulitis. Lymph : No lichenification or skin changes of chronic lymphedema.  CBC No results found for: WBC, HGB, HCT, MCV, PLT  BMET    Component Value Date/Time   BUN 12 12/10/2015 1517   CREATININE 1.15 12/10/2015 1517   GFRNONAA 63 12/10/2015 1517   GFRAA 73 12/10/2015 1517   CrCl cannot be calculated (Patient's most recent lab result is older than the maximum 21 days allowed.).  COAG No results found for: INR, PROTIME  Radiology No results found.   Assessment/Plan 1. Pain in both lower extremities Recommend:  I do not find evidence of Vascular pathology that would explain the patient's symptoms.  Noninvasive studies including the ABI's and the aorta iliac duplex are normal.  The patient has atypical pain symptoms for vascular disease  Noninvasive studies of the legs do not identify vascular problems  The patient should continue walking and begin a more formal exercise program.  The patient should continue his antiplatelet therapy and aggressive treatment of the lipid abnormalities. The patient should begin wearing graduated compression socks 15-20 mmHg strength to control his mild edema.  Patient will follow-up with me on a PRN basis  Further work-up of her lower extremity pain is deferred to the primary service     2. Benign essential HTN Continue antihypertensive medications as already ordered, these medications have been reviewed and there are no changes at this time.   3. Cerebrovascular accident (CVA), unspecified mechanism ([place]) Continue antiplatelet medications as already ordered, these medications have been reviewed and there are no changes at this time.   4. Controlled type 2 diabetes mellitus without complication, without long-term current use of insulin ([place]) Continue hypoglycemic medications as already ordered, these medications have been reviewed and there are no changes at this time.  Hgb A1C to be monitored  as already arranged by primary service   5. Mixed hyperlipidemia Continue statin as ordered and reviewed, no changes at this time     Lonnie Pilar, MD  09/07/2017 10:20 AM

## 2017-11-23 DIAGNOSIS — I10 Essential (primary) hypertension: Secondary | ICD-10-CM | POA: Diagnosis not present

## 2017-11-23 DIAGNOSIS — M5412 Radiculopathy, cervical region: Secondary | ICD-10-CM | POA: Diagnosis not present

## 2017-11-23 DIAGNOSIS — Z0000 Encounter for general adult medical examination without abnormal findings: Secondary | ICD-10-CM | POA: Diagnosis not present

## 2017-11-23 DIAGNOSIS — Z79899 Other long term (current) drug therapy: Secondary | ICD-10-CM | POA: Diagnosis not present

## 2017-11-23 DIAGNOSIS — E119 Type 2 diabetes mellitus without complications: Secondary | ICD-10-CM | POA: Diagnosis not present

## 2017-11-23 DIAGNOSIS — E7800 Pure hypercholesterolemia, unspecified: Secondary | ICD-10-CM | POA: Diagnosis not present

## 2017-11-23 DIAGNOSIS — J3489 Other specified disorders of nose and nasal sinuses: Secondary | ICD-10-CM | POA: Diagnosis not present

## 2017-11-23 DIAGNOSIS — E1122 Type 2 diabetes mellitus with diabetic chronic kidney disease: Secondary | ICD-10-CM | POA: Diagnosis not present

## 2017-11-23 DIAGNOSIS — I739 Peripheral vascular disease, unspecified: Secondary | ICD-10-CM | POA: Diagnosis not present

## 2017-12-10 DIAGNOSIS — M542 Cervicalgia: Secondary | ICD-10-CM | POA: Diagnosis not present

## 2017-12-10 DIAGNOSIS — M5412 Radiculopathy, cervical region: Secondary | ICD-10-CM | POA: Diagnosis not present

## 2017-12-14 ENCOUNTER — Other Ambulatory Visit: Payer: Self-pay | Admitting: Internal Medicine

## 2017-12-14 DIAGNOSIS — M542 Cervicalgia: Secondary | ICD-10-CM

## 2017-12-21 ENCOUNTER — Ambulatory Visit
Admission: RE | Admit: 2017-12-21 | Discharge: 2017-12-21 | Disposition: A | Discharge status: Home / Self Care | Payer: Medicare HMO | Source: Ambulatory Visit | Attending: Internal Medicine | Admitting: Internal Medicine

## 2017-12-21 DIAGNOSIS — M50222 Other cervical disc displacement at C5-C6 level: Secondary | ICD-10-CM | POA: Insufficient documentation

## 2017-12-21 DIAGNOSIS — M542 Cervicalgia: Secondary | ICD-10-CM | POA: Diagnosis not present

## 2017-12-21 DIAGNOSIS — M5412 Radiculopathy, cervical region: Secondary | ICD-10-CM | POA: Diagnosis not present

## 2017-12-21 DIAGNOSIS — I6523 Occlusion and stenosis of bilateral carotid arteries: Secondary | ICD-10-CM | POA: Insufficient documentation

## 2017-12-21 DIAGNOSIS — M4802 Spinal stenosis, cervical region: Secondary | ICD-10-CM | POA: Diagnosis not present

## 2017-12-24 ENCOUNTER — Ambulatory Visit: Payer: Medicare HMO

## 2018-02-04 DIAGNOSIS — C44722 Squamous cell carcinoma of skin of right lower limb, including hip: Secondary | ICD-10-CM | POA: Diagnosis not present

## 2018-02-04 DIAGNOSIS — D485 Neoplasm of uncertain behavior of skin: Secondary | ICD-10-CM | POA: Diagnosis not present

## 2018-02-24 DIAGNOSIS — C44722 Squamous cell carcinoma of skin of right lower limb, including hip: Secondary | ICD-10-CM | POA: Diagnosis not present

## 2018-02-24 DIAGNOSIS — C44622 Squamous cell carcinoma of skin of right upper limb, including shoulder: Secondary | ICD-10-CM | POA: Diagnosis not present

## 2018-03-02 DIAGNOSIS — B999 Unspecified infectious disease: Secondary | ICD-10-CM | POA: Diagnosis not present

## 2018-03-29 DIAGNOSIS — T50905A Adverse effect of unspecified drugs, medicaments and biological substances, initial encounter: Secondary | ICD-10-CM | POA: Diagnosis not present

## 2018-03-29 DIAGNOSIS — J069 Acute upper respiratory infection, unspecified: Secondary | ICD-10-CM | POA: Diagnosis not present

## 2018-03-29 DIAGNOSIS — M791 Myalgia, unspecified site: Secondary | ICD-10-CM | POA: Diagnosis not present

## 2018-03-29 DIAGNOSIS — E119 Type 2 diabetes mellitus without complications: Secondary | ICD-10-CM | POA: Diagnosis not present

## 2018-03-30 DIAGNOSIS — M791 Myalgia, unspecified site: Secondary | ICD-10-CM | POA: Diagnosis not present

## 2018-04-21 DIAGNOSIS — M5442 Lumbago with sciatica, left side: Secondary | ICD-10-CM | POA: Diagnosis not present

## 2018-04-21 DIAGNOSIS — M4807 Spinal stenosis, lumbosacral region: Secondary | ICD-10-CM | POA: Diagnosis not present

## 2018-04-21 DIAGNOSIS — G8929 Other chronic pain: Secondary | ICD-10-CM | POA: Diagnosis not present

## 2018-04-21 DIAGNOSIS — M79605 Pain in left leg: Secondary | ICD-10-CM | POA: Diagnosis not present

## 2018-04-22 ENCOUNTER — Other Ambulatory Visit: Payer: Self-pay | Admitting: Sports Medicine

## 2018-04-22 DIAGNOSIS — M4807 Spinal stenosis, lumbosacral region: Secondary | ICD-10-CM

## 2018-05-05 ENCOUNTER — Ambulatory Visit: Admission: RE | Admit: 2018-05-05 | Payer: Medicare HMO | Source: Ambulatory Visit

## 2018-05-31 DIAGNOSIS — I10 Essential (primary) hypertension: Secondary | ICD-10-CM | POA: Diagnosis not present

## 2018-05-31 DIAGNOSIS — Z79899 Other long term (current) drug therapy: Secondary | ICD-10-CM | POA: Diagnosis not present

## 2018-05-31 DIAGNOSIS — E119 Type 2 diabetes mellitus without complications: Secondary | ICD-10-CM | POA: Diagnosis not present

## 2018-05-31 DIAGNOSIS — Z23 Encounter for immunization: Secondary | ICD-10-CM | POA: Diagnosis not present

## 2018-05-31 DIAGNOSIS — E7800 Pure hypercholesterolemia, unspecified: Secondary | ICD-10-CM | POA: Diagnosis not present

## 2018-07-27 DIAGNOSIS — T50B95A Adverse effect of other viral vaccines, initial encounter: Secondary | ICD-10-CM | POA: Diagnosis not present

## 2018-07-27 DIAGNOSIS — M25512 Pain in left shoulder: Secondary | ICD-10-CM | POA: Diagnosis not present

## 2018-10-02 DIAGNOSIS — R6889 Other general symptoms and signs: Secondary | ICD-10-CM | POA: Diagnosis not present

## 2018-10-02 DIAGNOSIS — J029 Acute pharyngitis, unspecified: Secondary | ICD-10-CM | POA: Diagnosis not present

## 2018-10-02 DIAGNOSIS — J069 Acute upper respiratory infection, unspecified: Secondary | ICD-10-CM | POA: Diagnosis not present

## 2018-11-22 DIAGNOSIS — E1169 Type 2 diabetes mellitus with other specified complication: Secondary | ICD-10-CM | POA: Diagnosis not present

## 2018-11-22 DIAGNOSIS — E7800 Pure hypercholesterolemia, unspecified: Secondary | ICD-10-CM | POA: Diagnosis not present

## 2018-11-22 DIAGNOSIS — Z79899 Other long term (current) drug therapy: Secondary | ICD-10-CM | POA: Diagnosis not present

## 2018-11-22 DIAGNOSIS — I10 Essential (primary) hypertension: Secondary | ICD-10-CM | POA: Diagnosis not present

## 2018-11-22 DIAGNOSIS — H6123 Impacted cerumen, bilateral: Secondary | ICD-10-CM | POA: Diagnosis not present

## 2018-11-22 DIAGNOSIS — H60331 Swimmer's ear, right ear: Secondary | ICD-10-CM | POA: Diagnosis not present

## 2018-11-22 DIAGNOSIS — E1159 Type 2 diabetes mellitus with other circulatory complications: Secondary | ICD-10-CM | POA: Diagnosis not present

## 2018-11-24 DIAGNOSIS — H903 Sensorineural hearing loss, bilateral: Secondary | ICD-10-CM | POA: Diagnosis not present

## 2018-11-24 DIAGNOSIS — H9209 Otalgia, unspecified ear: Secondary | ICD-10-CM | POA: Diagnosis not present

## 2018-11-24 DIAGNOSIS — H6123 Impacted cerumen, bilateral: Secondary | ICD-10-CM | POA: Diagnosis not present

## 2019-04-19 DIAGNOSIS — C44629 Squamous cell carcinoma of skin of left upper limb, including shoulder: Secondary | ICD-10-CM | POA: Diagnosis not present

## 2019-04-19 DIAGNOSIS — L578 Other skin changes due to chronic exposure to nonionizing radiation: Secondary | ICD-10-CM | POA: Diagnosis not present

## 2019-04-19 DIAGNOSIS — D485 Neoplasm of uncertain behavior of skin: Secondary | ICD-10-CM | POA: Diagnosis not present

## 2019-05-23 DIAGNOSIS — E1169 Type 2 diabetes mellitus with other specified complication: Secondary | ICD-10-CM | POA: Diagnosis not present

## 2019-05-23 DIAGNOSIS — E7800 Pure hypercholesterolemia, unspecified: Secondary | ICD-10-CM | POA: Diagnosis not present

## 2019-05-23 DIAGNOSIS — I10 Essential (primary) hypertension: Secondary | ICD-10-CM | POA: Diagnosis not present

## 2019-05-23 DIAGNOSIS — Z0000 Encounter for general adult medical examination without abnormal findings: Secondary | ICD-10-CM | POA: Diagnosis not present

## 2019-05-23 DIAGNOSIS — Z79899 Other long term (current) drug therapy: Secondary | ICD-10-CM | POA: Diagnosis not present

## 2019-05-23 DIAGNOSIS — E1159 Type 2 diabetes mellitus with other circulatory complications: Secondary | ICD-10-CM | POA: Diagnosis not present

## 2019-06-23 DIAGNOSIS — H25813 Combined forms of age-related cataract, bilateral: Secondary | ICD-10-CM | POA: Diagnosis not present

## 2019-11-23 DIAGNOSIS — E7800 Pure hypercholesterolemia, unspecified: Secondary | ICD-10-CM | POA: Diagnosis not present

## 2019-11-23 DIAGNOSIS — E1121 Type 2 diabetes mellitus with diabetic nephropathy: Secondary | ICD-10-CM | POA: Diagnosis not present

## 2019-11-23 DIAGNOSIS — I10 Essential (primary) hypertension: Secondary | ICD-10-CM | POA: Diagnosis not present

## 2019-11-23 DIAGNOSIS — Z87891 Personal history of nicotine dependence: Secondary | ICD-10-CM | POA: Diagnosis not present

## 2019-11-23 DIAGNOSIS — N183 Chronic kidney disease, stage 3 unspecified: Secondary | ICD-10-CM | POA: Diagnosis not present

## 2019-11-23 DIAGNOSIS — E1122 Type 2 diabetes mellitus with diabetic chronic kidney disease: Secondary | ICD-10-CM | POA: Diagnosis not present

## 2019-11-23 DIAGNOSIS — N1831 Chronic kidney disease, stage 3a: Secondary | ICD-10-CM | POA: Diagnosis not present

## 2019-11-23 DIAGNOSIS — E1159 Type 2 diabetes mellitus with other circulatory complications: Secondary | ICD-10-CM | POA: Diagnosis not present

## 2019-11-23 DIAGNOSIS — Z79899 Other long term (current) drug therapy: Secondary | ICD-10-CM | POA: Diagnosis not present

## 2019-12-23 DIAGNOSIS — Z79899 Other long term (current) drug therapy: Secondary | ICD-10-CM | POA: Diagnosis not present

## 2019-12-23 DIAGNOSIS — E1122 Type 2 diabetes mellitus with diabetic chronic kidney disease: Secondary | ICD-10-CM | POA: Diagnosis not present

## 2019-12-23 DIAGNOSIS — N1831 Chronic kidney disease, stage 3a: Secondary | ICD-10-CM | POA: Diagnosis not present

## 2020-02-24 DIAGNOSIS — Z79899 Other long term (current) drug therapy: Secondary | ICD-10-CM | POA: Diagnosis not present

## 2020-02-24 DIAGNOSIS — E1121 Type 2 diabetes mellitus with diabetic nephropathy: Secondary | ICD-10-CM | POA: Diagnosis not present

## 2020-02-24 DIAGNOSIS — E1122 Type 2 diabetes mellitus with diabetic chronic kidney disease: Secondary | ICD-10-CM | POA: Diagnosis not present

## 2020-02-24 DIAGNOSIS — N1831 Chronic kidney disease, stage 3a: Secondary | ICD-10-CM | POA: Diagnosis not present

## 2020-02-24 DIAGNOSIS — Z87891 Personal history of nicotine dependence: Secondary | ICD-10-CM | POA: Diagnosis not present

## 2020-02-24 DIAGNOSIS — Z7984 Long term (current) use of oral hypoglycemic drugs: Secondary | ICD-10-CM | POA: Diagnosis not present

## 2020-05-07 ENCOUNTER — Other Ambulatory Visit: Payer: Self-pay

## 2020-05-07 ENCOUNTER — Encounter: Payer: Self-pay | Admitting: Emergency Medicine

## 2020-05-07 ENCOUNTER — Emergency Department: Payer: Medicare HMO

## 2020-05-07 ENCOUNTER — Inpatient Hospital Stay
Admission: EM | Admit: 2020-05-07 | Discharge: 2020-05-11 | DRG: 522 | Disposition: A | Discharge status: Home Health | Payer: Medicare HMO | Attending: Internal Medicine | Admitting: Internal Medicine

## 2020-05-07 DIAGNOSIS — H9190 Unspecified hearing loss, unspecified ear: Secondary | ICD-10-CM | POA: Diagnosis present

## 2020-05-07 DIAGNOSIS — S72012A Unspecified intracapsular fracture of left femur, initial encounter for closed fracture: Secondary | ICD-10-CM | POA: Diagnosis not present

## 2020-05-07 DIAGNOSIS — Y92009 Unspecified place in unspecified non-institutional (private) residence as the place of occurrence of the external cause: Secondary | ICD-10-CM

## 2020-05-07 DIAGNOSIS — Z23 Encounter for immunization: Secondary | ICD-10-CM

## 2020-05-07 DIAGNOSIS — Z01818 Encounter for other preprocedural examination: Secondary | ICD-10-CM | POA: Diagnosis not present

## 2020-05-07 DIAGNOSIS — S72002A Fracture of unspecified part of neck of left femur, initial encounter for closed fracture: Secondary | ICD-10-CM

## 2020-05-07 DIAGNOSIS — Z96642 Presence of left artificial hip joint: Secondary | ICD-10-CM | POA: Diagnosis not present

## 2020-05-07 DIAGNOSIS — E782 Mixed hyperlipidemia: Secondary | ICD-10-CM

## 2020-05-07 DIAGNOSIS — D696 Thrombocytopenia, unspecified: Secondary | ICD-10-CM | POA: Diagnosis present

## 2020-05-07 DIAGNOSIS — E7800 Pure hypercholesterolemia, unspecified: Secondary | ICD-10-CM | POA: Diagnosis not present

## 2020-05-07 DIAGNOSIS — S72042A Displaced fracture of base of neck of left femur, initial encounter for closed fracture: Secondary | ICD-10-CM | POA: Diagnosis not present

## 2020-05-07 DIAGNOSIS — W1830XA Fall on same level, unspecified, initial encounter: Secondary | ICD-10-CM | POA: Diagnosis present

## 2020-05-07 DIAGNOSIS — M533 Sacrococcygeal disorders, not elsewhere classified: Secondary | ICD-10-CM | POA: Diagnosis not present

## 2020-05-07 DIAGNOSIS — Z87891 Personal history of nicotine dependence: Secondary | ICD-10-CM | POA: Diagnosis not present

## 2020-05-07 DIAGNOSIS — S50312A Abrasion of left elbow, initial encounter: Secondary | ICD-10-CM | POA: Diagnosis present

## 2020-05-07 DIAGNOSIS — E119 Type 2 diabetes mellitus without complications: Secondary | ICD-10-CM

## 2020-05-07 DIAGNOSIS — Z419 Encounter for procedure for purposes other than remedying health state, unspecified: Secondary | ICD-10-CM

## 2020-05-07 DIAGNOSIS — I358 Other nonrheumatic aortic valve disorders: Secondary | ICD-10-CM | POA: Diagnosis present

## 2020-05-07 DIAGNOSIS — D62 Acute posthemorrhagic anemia: Secondary | ICD-10-CM | POA: Diagnosis not present

## 2020-05-07 DIAGNOSIS — Z888 Allergy status to other drugs, medicaments and biological substances status: Secondary | ICD-10-CM | POA: Diagnosis not present

## 2020-05-07 DIAGNOSIS — R9431 Abnormal electrocardiogram [ECG] [EKG]: Secondary | ICD-10-CM | POA: Diagnosis not present

## 2020-05-07 DIAGNOSIS — R52 Pain, unspecified: Secondary | ICD-10-CM | POA: Diagnosis not present

## 2020-05-07 DIAGNOSIS — S72002S Fracture of unspecified part of neck of left femur, sequela: Secondary | ICD-10-CM | POA: Diagnosis not present

## 2020-05-07 DIAGNOSIS — K5900 Constipation, unspecified: Secondary | ICD-10-CM | POA: Diagnosis not present

## 2020-05-07 DIAGNOSIS — M1612 Unilateral primary osteoarthritis, left hip: Secondary | ICD-10-CM | POA: Diagnosis present

## 2020-05-07 DIAGNOSIS — I10 Essential (primary) hypertension: Secondary | ICD-10-CM | POA: Diagnosis not present

## 2020-05-07 DIAGNOSIS — D72829 Elevated white blood cell count, unspecified: Secondary | ICD-10-CM | POA: Diagnosis present

## 2020-05-07 DIAGNOSIS — G8918 Other acute postprocedural pain: Secondary | ICD-10-CM | POA: Diagnosis not present

## 2020-05-07 DIAGNOSIS — E1151 Type 2 diabetes mellitus with diabetic peripheral angiopathy without gangrene: Secondary | ICD-10-CM | POA: Diagnosis present

## 2020-05-07 DIAGNOSIS — Z20822 Contact with and (suspected) exposure to covid-19: Secondary | ICD-10-CM | POA: Diagnosis not present

## 2020-05-07 DIAGNOSIS — E785 Hyperlipidemia, unspecified: Secondary | ICD-10-CM | POA: Diagnosis present

## 2020-05-07 DIAGNOSIS — W19XXXA Unspecified fall, initial encounter: Secondary | ICD-10-CM | POA: Diagnosis not present

## 2020-05-07 DIAGNOSIS — K4090 Unilateral inguinal hernia, without obstruction or gangrene, not specified as recurrent: Secondary | ICD-10-CM | POA: Diagnosis not present

## 2020-05-07 DIAGNOSIS — Z8673 Personal history of transient ischemic attack (TIA), and cerebral infarction without residual deficits: Secondary | ICD-10-CM | POA: Diagnosis not present

## 2020-05-07 DIAGNOSIS — N179 Acute kidney failure, unspecified: Secondary | ICD-10-CM | POA: Diagnosis not present

## 2020-05-07 DIAGNOSIS — M25552 Pain in left hip: Secondary | ICD-10-CM | POA: Diagnosis not present

## 2020-05-07 DIAGNOSIS — I517 Cardiomegaly: Secondary | ICD-10-CM | POA: Diagnosis not present

## 2020-05-07 DIAGNOSIS — Z471 Aftercare following joint replacement surgery: Secondary | ICD-10-CM | POA: Diagnosis not present

## 2020-05-07 LAB — BASIC METABOLIC PANEL
Anion gap: 10 (ref 5–15)
BUN: 20 mg/dL (ref 8–23)
CO2: 25 mmol/L (ref 22–32)
Calcium: 8.4 mg/dL — ABNORMAL LOW (ref 8.9–10.3)
Chloride: 100 mmol/L (ref 98–111)
Creatinine, Ser: 1.35 mg/dL — ABNORMAL HIGH (ref 0.61–1.24)
GFR calc Af Amer: 58 mL/min — ABNORMAL LOW (ref >60)
GFR calc non Af Amer: 50 mL/min — ABNORMAL LOW (ref >60)
Glucose, Bld: 158 mg/dL — ABNORMAL HIGH (ref 70–99)
Potassium: 3.7 mmol/L (ref 3.5–5.1)
Sodium: 135 mmol/L (ref 135–145)

## 2020-05-07 LAB — CBC WITH DIFFERENTIAL/PLATELET
Abs Immature Granulocytes: 0.06 10*3/uL (ref 0.00–0.07)
Basophils Absolute: 0 10*3/uL (ref 0.0–0.1)
Basophils Relative: 0 %
Eosinophils Absolute: 0 10*3/uL (ref 0.0–0.5)
Eosinophils Relative: 0 %
HCT: 44.5 % (ref 39.0–52.0)
Hemoglobin: 14.9 g/dL (ref 13.0–17.0)
Immature Granulocytes: 1 %
Lymphocytes Relative: 4 %
Lymphs Abs: 0.5 10*3/uL — ABNORMAL LOW (ref 0.7–4.0)
MCH: 32 pg (ref 26.0–34.0)
MCHC: 33.5 g/dL (ref 30.0–36.0)
MCV: 95.5 fL (ref 80.0–100.0)
Monocytes Absolute: 0.7 10*3/uL (ref 0.1–1.0)
Monocytes Relative: 6 %
Neutro Abs: 10.7 10*3/uL — ABNORMAL HIGH (ref 1.7–7.7)
Neutrophils Relative %: 89 %
Platelets: 147 10*3/uL — ABNORMAL LOW (ref 150–400)
RBC: 4.66 MIL/uL (ref 4.22–5.81)
RDW: 13.2 % (ref 11.5–15.5)
WBC: 11.9 10*3/uL — ABNORMAL HIGH (ref 4.0–10.5)
nRBC: 0 % (ref 0.0–0.2)

## 2020-05-07 LAB — GLUCOSE, CAPILLARY: Glucose-Capillary: 112 mg/dL — ABNORMAL HIGH (ref 70–99)

## 2020-05-07 LAB — PROTIME-INR
INR: 1 (ref 0.8–1.2)
Prothrombin Time: 13.1 seconds (ref 11.4–15.2)

## 2020-05-07 LAB — TYPE AND SCREEN
ABO/RH(D): A POS
Antibody Screen: NEGATIVE

## 2020-05-07 LAB — TROPONIN I (HIGH SENSITIVITY)
Troponin I (High Sensitivity): 6 ng/L (ref <18)
Troponin I (High Sensitivity): 9 ng/L (ref <18)

## 2020-05-07 LAB — SARS CORONAVIRUS 2 BY RT PCR (HOSPITAL ORDER, PERFORMED IN ~~LOC~~ HOSPITAL LAB): SARS Coronavirus 2: NEGATIVE

## 2020-05-07 LAB — HEPATIC FUNCTION PANEL
ALT: 25 U/L (ref 0–44)
AST: 28 U/L (ref 15–41)
Albumin: 4.1 g/dL (ref 3.5–5.0)
Alkaline Phosphatase: 54 U/L (ref 38–126)
Bilirubin, Direct: 0.1 mg/dL (ref 0.0–0.2)
Indirect Bilirubin: 0.9 mg/dL (ref 0.3–0.9)
Total Bilirubin: 1 mg/dL (ref 0.3–1.2)
Total Protein: 7.9 g/dL (ref 6.5–8.1)

## 2020-05-07 MED ORDER — MORPHINE SULFATE (PF) 4 MG/ML IV SOLN
4.0000 mg | Freq: Once | INTRAVENOUS | Status: AC
  Administered 2020-05-07: 4 mg via INTRAVENOUS
  Filled 2020-05-07: qty 1

## 2020-05-07 MED ORDER — ONDANSETRON HCL 4 MG/2ML IJ SOLN: 4.0000 mg | Freq: Once | INTRAMUSCULAR | Status: AC

## 2020-05-07 MED ORDER — METHOCARBAMOL 1000 MG/10ML IJ SOLN
500.0000 mg | Freq: Four times a day (QID) | INTRAVENOUS | Status: DC | PRN
  Filled 2020-05-07: qty 5

## 2020-05-07 MED ORDER — ONDANSETRON HCL 4 MG/2ML IJ SOLN
INTRAMUSCULAR | Status: AC
  Administered 2020-05-07: 4 mg via INTRAVENOUS
  Filled 2020-05-07: qty 2

## 2020-05-07 MED ORDER — PRAVASTATIN SODIUM 20 MG PO TABS
20.0000 mg | ORAL_TABLET | Freq: Every day | ORAL | Status: DC
  Administered 2020-05-08 – 2020-05-10 (×3): 20 mg via ORAL
  Filled 2020-05-07 (×3): qty 1

## 2020-05-07 MED ORDER — MORPHINE SULFATE (PF) 2 MG/ML IV SOLN
0.5000 mg | INTRAVENOUS | Status: DC | PRN
  Filled 2020-05-07: qty 1

## 2020-05-07 MED ORDER — TETANUS-DIPHTH-ACELL PERTUSSIS 5-2.5-18.5 LF-MCG/0.5 IM SUSP
0.5000 mL | Freq: Once | INTRAMUSCULAR | Status: AC
  Administered 2020-05-07: 0.5 mL via INTRAMUSCULAR
  Filled 2020-05-07: qty 0.5

## 2020-05-07 MED ORDER — CEFAZOLIN SODIUM-DEXTROSE 2-4 GM/100ML-% IV SOLN
2.0000 g | Freq: Once | INTRAVENOUS | Status: AC
  Administered 2020-05-08: 2 g via INTRAVENOUS
  Filled 2020-05-07 (×2): qty 100

## 2020-05-07 MED ORDER — SODIUM CHLORIDE 0.9 % IV BOLUS
500.0000 mL | Freq: Once | INTRAVENOUS | Status: AC
  Administered 2020-05-07: 500 mL via INTRAVENOUS

## 2020-05-07 MED ORDER — INSULIN ASPART 100 UNIT/ML ~~LOC~~ SOLN
0.0000 [IU] | SUBCUTANEOUS | Status: DC
  Administered 2020-05-08: 2 [IU] via SUBCUTANEOUS
  Administered 2020-05-08: 3 [IU] via SUBCUTANEOUS
  Administered 2020-05-08: 1 [IU] via SUBCUTANEOUS
  Administered 2020-05-09: 2 [IU] via SUBCUTANEOUS
  Administered 2020-05-09: 1 [IU] via SUBCUTANEOUS
  Administered 2020-05-09 (×2): 2 [IU] via SUBCUTANEOUS
  Administered 2020-05-09 – 2020-05-10 (×5): 1 [IU] via SUBCUTANEOUS
  Administered 2020-05-11: 2 [IU] via SUBCUTANEOUS
  Filled 2020-05-07 (×13): qty 1

## 2020-05-07 MED ORDER — MORPHINE SULFATE (PF) 2 MG/ML IV SOLN
2.0000 mg | INTRAVENOUS | Status: DC | PRN
  Administered 2020-05-07 – 2020-05-08 (×2): 2 mg via INTRAVENOUS
  Filled 2020-05-07: qty 1

## 2020-05-07 MED ORDER — HYDROCODONE-ACETAMINOPHEN 5-325 MG PO TABS
1.0000 | ORAL_TABLET | Freq: Four times a day (QID) | ORAL | Status: DC | PRN
  Administered 2020-05-07: 1 via ORAL
  Filled 2020-05-07: qty 1

## 2020-05-07 MED ORDER — METHOCARBAMOL 500 MG PO TABS
500.0000 mg | ORAL_TABLET | Freq: Four times a day (QID) | ORAL | Status: DC | PRN
  Administered 2020-05-08 – 2020-05-10 (×4): 500 mg via ORAL
  Filled 2020-05-07 (×5): qty 1

## 2020-05-07 MED ORDER — ONDANSETRON HCL 4 MG/2ML IJ SOLN
4.0000 mg | Freq: Once | INTRAMUSCULAR | Status: AC
  Administered 2020-05-07: 4 mg via INTRAVENOUS
  Filled 2020-05-07: qty 2

## 2020-05-07 MED ORDER — ONDANSETRON HCL 4 MG/2ML IJ SOLN
4.0000 mg | Freq: Four times a day (QID) | INTRAMUSCULAR | Status: DC | PRN
  Administered 2020-05-08: 4 mg via INTRAVENOUS

## 2020-05-07 NOTE — ED Provider Notes (Signed)
[facility] Emergency Department Provider Note  ____________________________________________  Time seen: Approximately 5:50 PM  I have reviewed the triage vital signs and the nursing notes.   HISTORY  Chief Complaint Fall and Hip Pain    HPI Lonnie Clarke is a 79 y.o. male with a history of hyperlipidemia hypertension diabetes who comes the ED complaining of left hip pain after a mechanical fall.  He was carrying boxes up steps from his carport into the house, and when trying to navigate through the screen porch door, he ran into the door frame and fell back, causing sudden severe pain in the left hip, nonradiating, worse with movement, no alleviating factors.  It has been constant since the fall.  Denies paresthesia or numbness in the leg.  Unable to bear weight.  Also sustained an abrasion to the left elbow, but denies significant pain there.  No head injury or loss of consciousness.      Past Medical History:  Diagnosis Date  . Hyperlipidemia   . Hypertension      Patient Active Problem List   Diagnosis Date Noted  . Leg pain 09/07/2017  . Atherosclerosis of native arteries of extremity with intermittent claudication ([place]) 07/07/2017  . Cerebrovascular accident (CVA) ([place]) 12/10/2015  . Microhematuria 12/10/2015  . Hyperlipidemia   . Controlled type 2 diabetes mellitus without complication ([place]) 15/17/6160  . Other long term (current) drug therapy 11/19/2014  . Anxiety about health 05/19/2014  . Benign essential HTN 05/19/2014  . Pure hypercholesterolemia 05/19/2014     Past Surgical History:  Procedure Laterality Date  . HERNIA REPAIR  2013     Prior to Admission medications   Medication Sig Start Date End Date Taking? Authorizing Provider  aspirin 81 MG chewable tablet Chew by mouth.    [provider]  hydrochlorothiazide (HYDRODIURIL) 25 MG tablet Take by mouth. 11/23/15   [provider]  pravastatin (PRAVACHOL)  20 MG tablet Take by mouth. 05/22/15   [provider]  quinapril (ACCUPRIL) 10 MG tablet Take by mouth. 11/23/15   [provider]     Allergies Amlodipine, Metoprolol, Ramipril, Triamterene-hctz, and Valsartan   Family History  Problem Relation Age of Onset  . Prostate cancer Neg Hx   . Bladder Cancer Neg Hx     Social History Social History   Tobacco Use  . Smoking status: Former Smoker    Quit date: 12/10/1999    Years since quitting: 20.4  . Smokeless tobacco: Never Used  Vaping Use  . Vaping Use: Never used  Substance Use Topics  . Alcohol use: Yes    Alcohol/week: 0.0 standard drinks  . Drug use: No    Review of Systems  Constitutional:   No fever or chills.  ENT:   No sore throat. No rhinorrhea. Cardiovascular:   No chest pain or syncope. Respiratory:   No dyspnea or cough. Gastrointestinal:   Negative for abdominal pain, vomiting and diarrhea.  Musculoskeletal: Left hip pain as above All other systems reviewed and are negative except as documented above in ROS and HPI.  ____________________________________________   PHYSICAL EXAM:  VITAL SIGNS: ED Triage Vitals  Enc Vitals Group     BP 05/07/20 1520 (!) 137/50     Pulse Rate 05/07/20 1520 93     Resp 05/07/20 1520 18     Temp 05/07/20 1520 98.7 F (37.1 C)     Temp Source 05/07/20 1520 Oral     SpO2 05/07/20 1520 96 %  Weight 05/07/20 1520 195 lb (88.5 kg)     Height 05/07/20 1520 6\' 1"  (1.854 m)     Head Circumference --      Peak Flow --      Pain Score 05/07/20 1527 3     Pain Loc --      Pain Edu? --      Excl. in [place]? --     Vital signs reviewed, nursing assessments reviewed.   Constitutional:   Alert and oriented. Non-toxic appearance. Eyes:   Conjunctivae are normal. EOMI. PERRL. ENT      Head:   Normocephalic and atraumatic.      Nose:   Normal      Mouth/Throat:   Moist mucosa      Neck:   No meningismus. Full ROM. Hematological/Lymphatic/Immunilogical:   No  cervical lymphadenopathy. Cardiovascular:   RRR. Symmetric bilateral radial and DP pulses.  No murmurs. Cap refill less than 2 seconds. Respiratory:   Normal respiratory effort without tachypnea/retractions. Breath sounds are clear and equal bilaterally. No wheezes/rales/rhonchi. Gastrointestinal:   Soft and nontender. Non distended. There is no CVA tenderness.  No rebound, rigidity, or guarding.  Musculoskeletal: Pain with range of motion of the left hip.  There is tenderness at the left hip.  Other extremities, long bones are all stable and nontender.  No tenderness of the left upper extremity. Neurologic:   Normal speech and language.  Motor and sensation grossly intact. No acute focal neurologic deficits are appreciated.  Skin:    Skin is warm, dry with small skin tear over the left proximal elbow, no laceration.. No rash noted.  No petechiae, purpura, or bullae.  ____________________________________________    LABS (pertinent positives/negatives) (all labs ordered are listed, but only abnormal results are displayed) Labs Reviewed  SARS CORONAVIRUS 2 BY RT PCR (HOSPITAL ORDER, [place] [facility])  BASIC METABOLIC PANEL  PROTIME-INR  CBC WITH DIFFERENTIAL/PLATELET  TYPE AND SCREEN   ____________________________________________   EKG  Interpreted by me Normal sinus rhythm rate of 66, normal axis and intervals.  Right bundle branch block.  No acute ischemic changes.  ____________________________________________    RADIOLOGY  DG Chest 1 View  Result Date: 05/07/2020 CLINICAL DATA:  Mechanical fall at home EXAM: CHEST  1 VIEW COMPARISON:  None. FINDINGS: Mildly low lung volumes. No focal opacity or pleural effusion. Borderline cardiomegaly with aortic atherosclerosis. No pneumothorax. IMPRESSION: No active disease. Borderline cardiomegaly. Electronically Signed   By: Donavan Foil M.D.   On: 05/07/2020 17:37   DG Hip Unilat With Pelvis 2-3 Views  Left  Result Date: 05/07/2020 CLINICAL DATA:  Fall.  Decreased range of motion. EXAM: DG HIP (WITH OR WITHOUT PELVIS) 2-3V LEFT COMPARISON:  None. FINDINGS: Minimally comminuted subcapital left femur fracture with varus angulation and lateral displacement of distal fracture fragments. Impaction. No dislocation. Sacroiliac joints are symmetric. Surgical clips along the left pelvic sidewall. IMPRESSION: Impacted left femoral neck fracture as detailed above. Electronically Signed   By: Abigail Miyamoto M.D.   On: 05/07/2020 17:37    ____________________________________________   PROCEDURES Procedures  ____________________________________________    CLINICAL IMPRESSION / ASSESSMENT AND PLAN / ED COURSE  Medications ordered in the ED: Medications  ondansetron (ZOFRAN) injection 4 mg (has no administration in time range)  morphine 4 MG/ML injection 4 mg (has no administration in time range)  Tdap (BOOSTRIX) injection 0.5 mL (has no administration in time range)  ceFAZolin (ANCEF) IVPB 2g/100 mL premix (has no administration  in time range)  sodium chloride 0.9 % bolus 500 mL (500 mLs Intravenous New Bag/Given 05/07/20 1816)    Pertinent labs & imaging results that were available during my care of the patient were reviewed by me and considered in my medical decision making (see chart for details).  MERLON ALCORTA was evaluated in Emergency Department on 05/07/2020 for the symptoms described in the history of present illness. He was evaluated in the context of the global COVID-19 pandemic, which necessitated consideration that the patient might be at risk for infection with the SARS-CoV-2 virus that causes COVID-19. Institutional protocols and algorithms that pertain to the evaluation of patients at risk for COVID-19 are in a state of rapid change based on information released by regulatory bodies including the CDC and federal and state organizations. These policies and algorithms were followed during  the patient's care in the ED.   Patient presents with left hip pain after mechanical fall, x-ray revealed subcapital left hip fracture.  Will discuss with orthopedics and hospitalist for further management.  Will place IV, check preop labs, morphine 4 mg IV, Zofran 4 mg IV, IV fluids for hydration.  ----------------------------------------- 6:18 PM on 05/07/2020 -----------------------------------------  Discussed with Dr. Posey Pronto from orthopedics who plans to do left hip hemiarthroplasty tomorrow afternoon.  Will allow the patient to eat tonight as desired.  Hospitalist consult requested.      ____________________________________________   FINAL CLINICAL IMPRESSION(S) / ED DIAGNOSES    Final diagnoses:  Subcapital fracture of left hip, closed, initial encounter ([place])  Type 2 diabetes mellitus without complication, without long-term current use of insulin [facility])     ED Discharge Orders    None      Portions of this note were generated with dragon dictation software. Dictation errors may occur despite best attempts at proofreading.   Carrie Mew, MD 05/07/20 [phone]

## 2020-05-07 NOTE — ED Triage Notes (Signed)
Pt in via [facility] from home, reports mechanical fall at home, injuring left hip w/ decreased ROM.  NAD noted at this time.

## 2020-05-07 NOTE — ED Triage Notes (Signed)
Pt in via EMS from home. EMS reports pt was carrying boxes and fell. Pt c/o left hip pain. Pt reports feels like a muscle tear and cannot lift left leg. No LOC, No head injury. Pt with small lac on left elbow.

## 2020-05-07 NOTE — H&P (Addendum)
Lonnie Clarke JKD:[id number] DOB: January 03, 1941 DOA: 05/07/2020     PCP: Ezequiel Kayser, MD   Outpatient Specialists: NONE    Patient arrived to ER on 05/07/20 at 1439 Referred by Attending Carrie Mew, MD   Patient coming from: home Lives a With family    Chief Complaint:   Chief Complaint  Patient presents with  . Fall  . Hip Pain    HPI: Lonnie Clarke is a 79 y.o. male with medical history significant ofhyperlipidemia hypertension diabetes , PAD, CVA, HOH    Presented with  Left hip pain after mechanical fall Was moving boxes and run into a door that knocked him backwards. He ha sudden severe pain in left hip. 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 SARS Coronavirus 2 by RT PCR (hospital order, performed in [facility] hospital lab) Nasopharyngeal Nasopharyngeal Swab  . DG Hip Unilat With Pelvis 2-3 Views Left  . DG Chest 1 View  . Basic metabolic panel  .  Protime-INR  . CBC with Differential  . Diet regular Room service appropriate? Yes; Fluid consistency: Thin  . Diet NPO time specified  . Diet NPO time specified  . NPO Except for Meds  . Patient may eat/drink  . Informed Consent Details: Physician/Practitioner Attestation; Transcribe to consent form and obtain patient signature  . Consult to hospitalist  . ED EKG  . Type and screen [place]  . [place] lock IV    Following Medications were ordered in ER: Medications  ceFAZolin (ANCEF) IVPB 2g/100 mL premix (has no administration in time range)  ondansetron (ZOFRAN) injection 4 mg (4 mg Intravenous Given 05/07/20 1817)  sodium chloride 0.9 % bolus 500 mL (500 mLs Intravenous New Bag/Given 05/07/20 1816)  morphine 4 MG/ML injection 4 mg (4 mg Intravenous Given 05/07/20 1817)  Tdap (BOOSTRIX) injection 0.5 mL (0.5 mLs Intramuscular Given 05/07/20 1821)        Consult Orders  (From admission, onward)         Start     Ordered   05/07/20 1802  Consult to hospitalist  Once       Provider:  (Not yet assigned)  Question Answer Comment  Place call to:  ED, callback [phone]   Reason for Consult Admit   Diagnosis/Clinical Info for Consult: left hip fracture      05/07/20 1802          Significant initial  Findings: Abnormal Labs Reviewed - No abnormal labs to display   Otherwise labs showing:    Recent Labs  Lab 05/07/20 1800  NA 135  K 3.7  CO2 25  GLUCOSE 158*  BUN 20  CREATININE 1.35*  CALCIUM 8.4*    Cr     Up from baseline see below Lab Results  Component Value Date   CREATININE 1.35 (H) 05/07/2020   CREATININE 1.15 12/10/2015    No results for input(s): AST, ALT, ALKPHOS, BILITOT, PROT, ALBUMIN in the last 168 hours. No results found for: CALCIUM, PHOS      WBC      Component Value Date/Time   WBC 11.9 (H) 05/07/2020 1800   ANC    Component Value Date/Time   NEUTROABS 10.7 (H) 05/07/2020 1800      Plt: Lab Results    Component Value Date   PLT 147 (L) 05/07/2020       HG/HCT   Stable,     Component Value Date/Time   HGB 14.9 05/07/2020 1800   HCT 44.5 05/07/2020 1800     Troponin 6    ECG: Ordered Personally reviewed by me showing: HR : 66 Rhythm:   SR  RBBB   nonspecific changes,  t wave abn QTC 460   DM  labs:  HbA1C: No results for input(s): HGBA1C in the last 8760 hours.     CBG (last 3)  No results for input(s): GLUCAP in the last 72 hours.     UA not ordered       Ordered   CXR -  NON acute    Left hip - fem neck FRX    ED Triage Vitals  Enc Vitals Group     BP 05/07/20 1520 (!) 137/50     Pulse Rate 05/07/20 1520 93     Resp 05/07/20 1520 18     Temp 05/07/20 1520 98.7 F (37.1 C)     Temp Source 05/07/20 1520 Oral     SpO2 05/07/20 1520 96 %     Weight 05/07/20 1520 195 lb (88.5 kg)     Height 05/07/20 1520 6\' 1"  (1.854 m)     Head Circumference --      Peak Flow --      Pain Score 05/07/20 1527 3     Pain Loc --      Pain Edu? --      Excl. in [place]? --   TMAX(24)@       Latest  Blood pressure (!) 137/50, pulse 93, temperature 98.7 F (37.1 C), temperature source Oral, resp. rate 18, height 6\' 1"  (1.854 m), weight 88.5 kg, SpO2 96 %.     Review of Systems:    Pertinent positives include: left hip pain   Constitutional:  No weight loss, night sweats, Fevers, chills, fatigue, weight loss  HEENT:  No headaches, Difficulty swallowing,Tooth/dental problems,Sore throat,  No sneezing, itching, ear ache, nasal congestion, post nasal drip,  Cardio-vascular:  No chest pain, Orthopnea, PND, anasarca, dizziness, palpitations.no Bilateral lower extremity swelling  GI:  No heartburn, indigestion, abdominal pain, nausea, vomiting, diarrhea, change in bowel habits, loss of appetite, melena, blood in stool, hematemesis Resp:  no shortness of breath at rest. No dyspnea on exertion, No excess mucus,  no productive cough, No non-productive cough, No coughing up of  blood.No change in color of mucus.No wheezing. Skin:  no rash or lesions. No jaundice GU:  no dysuria, change in color of urine, no urgency or frequency. No straining to urinate.  No flank pain.  Musculoskeletal:  No joint pain or no joint swelling. No decreased range of motion. No back pain.  Psych:  No change in mood or affect. No depression or anxiety. No memory loss.  Neuro: no localizing neurological complaints, no tingling, no weakness, no double vision, no gait abnormality, no slurred speech, no confusion  All systems reviewed and apart from [place] all are negative  Past Medical History:   Past Medical History:  Diagnosis Date  . Hyperlipidemia   . Hypertension       Past Surgical History:  Procedure Laterality Date  . HERNIA REPAIR  2013    Social History:  Ambulatory  independently      reports that he quit smoking about 20 years ago. He has never used smokeless tobacco. He reports current alcohol use. He reports that he does not use drugs.   Family History:   Family History  Problem Relation Age of Onset  . Prostate cancer Neg Hx   . Bladder Cancer Neg Hx     Allergies: Allergies  Allergen Reactions  . Amlodipine Other (See Comments)    Irritation of gums  . Metoprolol Other (See Comments)    Fatigue   . Ramipril Nausea Only  . Triamterene-Hctz Other (See Comments)    Weakness   . Valsartan Other (See Comments)    Irritation of lips     Prior to Admission medications   Medication Sig Start Date End Date Taking? Authorizing Provider  aspirin 81 MG chewable tablet Chew by mouth.    [provider]  hydrochlorothiazide (HYDRODIURIL) 25 MG tablet Take by mouth. 11/23/15   [provider]  pravastatin (PRAVACHOL) 20 MG tablet Take by mouth. 05/22/15   [provider]  quinapril (ACCUPRIL) 10 MG tablet Take by mouth. 11/23/15   [provider]   Physical Exam: Vitals with BMI 05/07/2020 09/07/2017 07/06/2017  Height 6'  1" 6\' 1"  6\' 1"   Weight 195 lbs 203 lbs 203 lbs 13 oz  BMI 25.73 19.14 78.29  Systolic 562 130 865  Diastolic 50 76 73  Pulse 93 59 64     1. General:  in No  Acute distress   Chronically ill-appearing 2. Psychological: Alert and   Oriented 3. Head/ENT:     Dry Mucous Membranes                          Head Non traumatic, neck supple                            Poor Dentition 4. SKIN:  decreased Skin turgor,  Skin clean Dry and intact no rash 5. Heart: Regular rate and rhythm no Murmur, no Rub or gallop 6. Lungs:  no wheezes or crackles   7. Abdomen: Soft, non-tender, Non distended   obese  bowel sounds present 8. Lower extremities: no clubbing, cyanosis, no edema 9. Neurologically Grossly intact, moving all 4 extremities equally  10. MSK: Normal range of motion limited by pain    All other LABS:     Recent Labs  Lab 05/07/20 1800  WBC 11.9*  NEUTROABS 10.7*  HGB 14.9  HCT 44.5  MCV 95.5  PLT 147*     Recent Labs  Lab 05/07/20 1800  NA 135  K 3.7  CL 100  CO2 25  GLUCOSE 158*  BUN 20  CREATININE 1.35*  CALCIUM 8.4*     No results for input(s): AST, ALT, ALKPHOS, BILITOT, PROT, ALBUMIN in the last 168 hours.       Radiological Exams on Admission: DG Chest 1 View  Result Date: 05/07/2020 CLINICAL DATA:  Mechanical fall at home EXAM: CHEST  1 VIEW COMPARISON:  None. FINDINGS: Mildly low lung volumes. No focal opacity or pleural effusion. Borderline cardiomegaly with aortic atherosclerosis. No pneumothorax. IMPRESSION: No active disease. Borderline cardiomegaly. Electronically Signed   By: Donavan Foil M.D.   On: 05/07/2020 17:37   DG Hip Unilat With Pelvis 2-3 Views Left  Result Date: 05/07/2020 CLINICAL DATA:  Fall.  Decreased range of motion. EXAM: DG HIP (WITH OR WITHOUT PELVIS) 2-3V LEFT COMPARISON:  None. FINDINGS: Minimally comminuted subcapital left femur fracture with varus angulation and lateral displacement of distal fracture fragments. Impaction. No  dislocation. Sacroiliac joints are symmetric. Surgical clips along the left pelvic sidewall. IMPRESSION: Impacted left femoral neck fracture as detailed above. Electronically Signed   By: Abigail Miyamoto M.D.   On: 05/07/2020 17:37    Chart has been reviewed  Assessment/Plan  79 y.o. male with medical history significant ofhyperlipidemia hypertension diabetes , PAD, CVA, HOH admitted for Left hip fracture  Present on Admission: . Closed left hip fracture ([place]) -  - management as per orthopedics,  plan to operate tomorrow PM   Keep nothing by mouth post midnight. Patient   not on anticoagulation antiplatelet agents    on hold Ordered type and screen,   order a vitamin D level  Patient at baseline able to walk a flight of stairs or 100 feet     Patient denies any chest pain or shortness of breath currently and/or with exertion,  ECG showing no evidence of acute ischemia but abnormal   no known history of coronary artery disease,  COPD Liver failure  CKD known hx of PAD Given advanced age patient is at least moderate  risk  which has been discussed with family but at this point no furthther cardiac workup is indicated.    will order echo  Given abnormal ECG    . Benign essential HTN -stable continue home meds  . Hyperlipidemia - stable resume home meds when able, fo rnow on hold waiting on labs  PAD- resume aspirin when cleared by orthopedics Other plan as per orders.  AKI - will hold ACEi obtain urine electrolytes  Dm2 -  - Order Sensitive  SSI     -  check TSH and HgA1C  - Hold by mouth medications    DVT prophylaxis:  SCD       Code Status:    Code Status: Not on file FULL CODE   as per patient   I had personally discussed CODE STATUS with patient   Family Communication:   Family not at  Bedside    Disposition Plan:     likely will need placement for rehabilitation                            Following barriers for discharge:  Pain controlled with PO medications                                                          Will need consultants to evaluate patient prior to discharge                        Would benefit from PT/OT eval prior to DC                                         Consults called:   orhopedics Dr. Posey Pronto  Admission status:  ED Disposition    ED Disposition Condition [place]: [place] [[id number]]  Level of Care: Med-Surg [16]  Covid Evaluation: Asymptomatic Screening Protocol (No Symptoms)  Diagnosis: Closed left hip fracture [facility]) [[id number]]  Admitting Physician: Toy Baker [[id number]]  Attending Physician: Toy Baker [[id number]]  Estimated length of stay: 3 - 4 days  Certification:: I certify this patient will need inpatient services for at least 2 midnights      inpatient     I Expect 2 midnight stay secondary to severity of patient's current illness need for inpatient interventions justified by the following:  Severe lab/radiological/exam abnormalities including:  Left hip fracture   and extensive comorbidities including:  DM2     That are currently affecting medical management.  I expect  patient to be hospitalized for 2 midnights requiring inpatient medical care.  Patient is at high risk for adverse outcome (such as loss of life or disability) if not treated.  Indication for inpatient stay as follows:  Severe change from baseline regarding mental status   severe pain requiring acute inpatient management,  inability to maintain oral hydration    Need for operative/procedural  intervention   Need for IV antibiotics, IV fluids  IV pain medications,      Level of care     tele  For  24H        No results found for: [id number]   Precautions: admitted as asymptomatic screening protocol   PPE: Used by the provider:   N95  eye Goggles,  Gloves    Angelita Harnack 05/07/2020, 8:35 PM    [facility]       after 2 AM please page floor coverage PA If 7AM-7PM, please contact the day team taking care of the patient using [email or web address]   Patient was evaluated in the context of the global COVID-19 pandemic, which necessitated consideration that the patient might be at risk for infection with the SARS-CoV-2 virus that causes COVID-19. Institutional protocols and algorithms that pertain to the evaluation of patients at risk for COVID-19 are in a state of rapid change based on information released by regulatory bodies including the CDC and federal and state organizations. These policies and algorithms were followed during the patient's care.

## 2020-05-07 NOTE — Progress Notes (Signed)
Full consult note and discussion with patient to follow tomorrow AM.  Called by ED staff. Imaging reviewed.  - Plan for surgery (hip hemiarthroplasty) tomorrow, likely afternoon.  - NPO after midnight - Hold anticoagulation - Admit to Hospitalist team.

## 2020-05-07 NOTE — ED Notes (Signed)
Pt resting quietly. Pt has a dinner tray at the bedside. Wife at the bedside. Pt updated on plan of care.

## 2020-05-07 NOTE — ED Notes (Signed)
Pt given dinner meal tray

## 2020-05-08 ENCOUNTER — Other Ambulatory Visit: Payer: Self-pay

## 2020-05-08 ENCOUNTER — Inpatient Hospital Stay: Payer: Medicare HMO

## 2020-05-08 ENCOUNTER — Encounter
Admission: EM | Disposition: A | Discharge status: Home Health | Payer: Self-pay | Source: Home / Self Care | Attending: Internal Medicine

## 2020-05-08 ENCOUNTER — Encounter: Payer: Self-pay | Admitting: Internal Medicine

## 2020-05-08 ENCOUNTER — Inpatient Hospital Stay: Payer: Medicare HMO | Admitting: Anesthesiology

## 2020-05-08 ENCOUNTER — Inpatient Hospital Stay (HOSPITAL_COMMUNITY)
Admit: 2020-05-08 | Discharge: 2020-05-08 | Disposition: A | Discharge status: Home / Self Care | Payer: Medicare HMO | Attending: Orthopedic Surgery | Admitting: Orthopedic Surgery

## 2020-05-08 DIAGNOSIS — R9431 Abnormal electrocardiogram [ECG] [EKG]: Secondary | ICD-10-CM

## 2020-05-08 DIAGNOSIS — N179 Acute kidney failure, unspecified: Secondary | ICD-10-CM | POA: Diagnosis not present

## 2020-05-08 DIAGNOSIS — I10 Essential (primary) hypertension: Secondary | ICD-10-CM

## 2020-05-08 DIAGNOSIS — S72002S Fracture of unspecified part of neck of left femur, sequela: Secondary | ICD-10-CM | POA: Diagnosis not present

## 2020-05-08 HISTORY — PX: HIP ARTHROPLASTY: SHX981

## 2020-05-08 LAB — CBC
HCT: 39.4 % (ref 39.0–52.0)
Hemoglobin: 13.3 g/dL (ref 13.0–17.0)
MCH: 31.9 pg (ref 26.0–34.0)
MCHC: 33.8 g/dL (ref 30.0–36.0)
MCV: 94.5 fL (ref 80.0–100.0)
Platelets: 148 10*3/uL — ABNORMAL LOW (ref 150–400)
RBC: 4.17 MIL/uL — ABNORMAL LOW (ref 4.22–5.81)
RDW: 13 % (ref 11.5–15.5)
WBC: 10.5 10*3/uL (ref 4.0–10.5)
nRBC: 0 % (ref 0.0–0.2)

## 2020-05-08 LAB — GLUCOSE, CAPILLARY
Glucose-Capillary: 107 mg/dL — ABNORMAL HIGH (ref 70–99)
Glucose-Capillary: 119 mg/dL — ABNORMAL HIGH (ref 70–99)
Glucose-Capillary: 123 mg/dL — ABNORMAL HIGH (ref 70–99)
Glucose-Capillary: 123 mg/dL — ABNORMAL HIGH (ref 70–99)
Glucose-Capillary: 179 mg/dL — ABNORMAL HIGH (ref 70–99)
Glucose-Capillary: 212 mg/dL — ABNORMAL HIGH (ref 70–99)

## 2020-05-08 LAB — BASIC METABOLIC PANEL
Anion gap: 11 (ref 5–15)
BUN: 22 mg/dL (ref 8–23)
CO2: 25 mmol/L (ref 22–32)
Calcium: 8.2 mg/dL — ABNORMAL LOW (ref 8.9–10.3)
Chloride: 100 mmol/L (ref 98–111)
Creatinine, Ser: 1.18 mg/dL (ref 0.61–1.24)
GFR calc Af Amer: >60 mL/min (ref >60)
GFR calc non Af Amer: 59 mL/min — ABNORMAL LOW (ref >60)
Glucose, Bld: 120 mg/dL — ABNORMAL HIGH (ref 70–99)
Potassium: 3.7 mmol/L (ref 3.5–5.1)
Sodium: 136 mmol/L (ref 135–145)

## 2020-05-08 LAB — HEMOGLOBIN A1C
Hgb A1c MFr Bld: 5.5 % (ref 4.8–5.6)
Mean Plasma Glucose: 111.15 mg/dL

## 2020-05-08 LAB — MRSA PCR SCREENING: MRSA by PCR: NEGATIVE

## 2020-05-08 LAB — VITAMIN D 25 HYDROXY (VIT D DEFICIENCY, FRACTURES): Vit D, 25-Hydroxy: 93.32 ng/mL (ref 30–100)

## 2020-05-08 SURGERY — HEMIARTHROPLASTY, HIP, DIRECT ANTERIOR APPROACH, FOR FRACTURE
Anesthesia: Spinal | Site: Hip | Laterality: Left

## 2020-05-08 MED ORDER — DOCUSATE SODIUM 100 MG PO CAPS
100.0000 mg | ORAL_CAPSULE | Freq: Two times a day (BID) | ORAL | Status: DC
  Administered 2020-05-08 – 2020-05-11 (×6): 100 mg via ORAL
  Filled 2020-05-08 (×6): qty 1

## 2020-05-08 MED ORDER — SODIUM CHLORIDE (PF) 0.9 % IJ SOLN
INTRAMUSCULAR | Status: DC | PRN
  Administered 2020-05-08: 40 mL

## 2020-05-08 MED ORDER — TRAMADOL HCL 50 MG PO TABS
50.0000 mg | ORAL_TABLET | Freq: Four times a day (QID) | ORAL | Status: DC
  Administered 2020-05-08 – 2020-05-11 (×9): 50 mg via ORAL
  Filled 2020-05-08 (×11): qty 1

## 2020-05-08 MED ORDER — PROPOFOL 10 MG/ML IV BOLUS
INTRAVENOUS | Status: DC | PRN
  Administered 2020-05-08: 20 mg via INTRAVENOUS
  Administered 2020-05-08: 10 mg via INTRAVENOUS
  Administered 2020-05-08 (×2): 20 mg via INTRAVENOUS

## 2020-05-08 MED ORDER — CHLORHEXIDINE GLUCONATE CLOTH 2 % EX PADS
6.0000 | MEDICATED_PAD | Freq: Every day | CUTANEOUS | Status: DC
  Administered 2020-05-08: 6 via TOPICAL

## 2020-05-08 MED ORDER — ALUM & MAG HYDROXIDE-SIMETH 200-200-20 MG/5ML PO SUSP: 30.0000 mL | ORAL | Status: DC | PRN

## 2020-05-08 MED ORDER — KETAMINE HCL 10 MG/ML IJ SOLN
INTRAMUSCULAR | Status: DC | PRN
  Administered 2020-05-08: 20 mg via INTRAVENOUS

## 2020-05-08 MED ORDER — NEOMYCIN-POLYMYXIN B GU 40-200000 IR SOLN
Status: DC | PRN
  Administered 2020-05-08: 4 mL

## 2020-05-08 MED ORDER — ZOLPIDEM TARTRATE 5 MG PO TABS: 5.0000 mg | ORAL_TABLET | Freq: Every evening | ORAL | Status: DC | PRN

## 2020-05-08 MED ORDER — MAGNESIUM HYDROXIDE 400 MG/5ML PO SUSP
30.0000 mL | Freq: Every day | ORAL | Status: DC | PRN
  Administered 2020-05-08: 30 mL via ORAL
  Filled 2020-05-08: qty 30

## 2020-05-08 MED ORDER — ENOXAPARIN SODIUM 40 MG/0.4ML ~~LOC~~ SOLN
40.0000 mg | SUBCUTANEOUS | Status: DC
  Administered 2020-05-09 – 2020-05-11 (×3): 40 mg via SUBCUTANEOUS
  Filled 2020-05-08 (×3): qty 0.4

## 2020-05-08 MED ORDER — NEOMYCIN-POLYMYXIN B GU 40-200000 IR SOLN
Status: AC
  Filled 2020-05-08: qty 4

## 2020-05-08 MED ORDER — BUPIVACAINE-EPINEPHRINE (PF) 0.25% -1:200000 IJ SOLN
INTRAMUSCULAR | Status: AC
  Filled 2020-05-08: qty 30

## 2020-05-08 MED ORDER — HYDROCODONE-ACETAMINOPHEN 7.5-325 MG PO TABS
1.0000 | ORAL_TABLET | ORAL | Status: DC | PRN
  Administered 2020-05-08: 2 via ORAL
  Filled 2020-05-08: qty 2

## 2020-05-08 MED ORDER — PHENOL 1.4 % MT LIQD: 1.0000 | OROMUCOSAL | Status: DC | PRN

## 2020-05-08 MED ORDER — SODIUM CHLORIDE FLUSH 0.9 % IV SOLN
INTRAVENOUS | Status: AC
  Filled 2020-05-08: qty 40

## 2020-05-08 MED ORDER — CEFAZOLIN SODIUM-DEXTROSE 2-4 GM/100ML-% IV SOLN
2.0000 g | Freq: Once | INTRAVENOUS | Status: AC
  Administered 2020-05-08: 2 g via INTRAVENOUS
  Filled 2020-05-08: qty 100

## 2020-05-08 MED ORDER — CEFAZOLIN SODIUM-DEXTROSE 2-4 GM/100ML-% IV SOLN
INTRAVENOUS | Status: AC
  Administered 2020-05-08: 2 g via INTRAVENOUS
  Filled 2020-05-08: qty 100

## 2020-05-08 MED ORDER — PROPOFOL 500 MG/50ML IV EMUL
INTRAVENOUS | Status: DC | PRN
  Administered 2020-05-08: 50 ug/kg/min via INTRAVENOUS
  Administered 2020-05-08: 75 ug/kg/min via INTRAVENOUS

## 2020-05-08 MED ORDER — ONDANSETRON HCL 4 MG/2ML IJ SOLN: 4.0000 mg | Freq: Once | INTRAMUSCULAR | Status: DC | PRN

## 2020-05-08 MED ORDER — SODIUM CHLORIDE 0.9 % IV SOLN
INTRAVENOUS | Status: DC | PRN
  Administered 2020-05-08: 25 ug/min via INTRAVENOUS

## 2020-05-08 MED ORDER — HYDROCODONE-ACETAMINOPHEN 5-325 MG PO TABS
1.0000 | ORAL_TABLET | ORAL | Status: DC | PRN
  Administered 2020-05-08 – 2020-05-09 (×2): 2 via ORAL
  Filled 2020-05-08 (×2): qty 2

## 2020-05-08 MED ORDER — SODIUM CHLORIDE 0.9 % IV SOLN
INTRAVENOUS | Status: DC | PRN
Start: 2020-05-08 — End: 2020-05-08

## 2020-05-08 MED ORDER — ONDANSETRON HCL 4 MG/2ML IJ SOLN
4.0000 mg | Freq: Four times a day (QID) | INTRAMUSCULAR | Status: DC | PRN
  Filled 2020-05-08: qty 2

## 2020-05-08 MED ORDER — PROPOFOL 10 MG/ML IV BOLUS
INTRAVENOUS | Status: AC
  Filled 2020-05-08: qty 40

## 2020-05-08 MED ORDER — GLYCOPYRROLATE 0.2 MG/ML IJ SOLN
INTRAMUSCULAR | Status: DC | PRN
Start: 2020-05-08 — End: 2020-05-08
  Administered 2020-05-08: .2 mg via INTRAVENOUS

## 2020-05-08 MED ORDER — METOCLOPRAMIDE HCL 5 MG/ML IJ SOLN
5.0000 mg | Freq: Three times a day (TID) | INTRAMUSCULAR | Status: DC | PRN
  Administered 2020-05-08: 10 mg via INTRAVENOUS
  Filled 2020-05-08: qty 2

## 2020-05-08 MED ORDER — CEFAZOLIN SODIUM-DEXTROSE 2-4 GM/100ML-% IV SOLN
INTRAVENOUS | Status: AC
  Filled 2020-05-08: qty 100

## 2020-05-08 MED ORDER — BISACODYL 10 MG RE SUPP: 10.0000 mg | Freq: Every day | RECTAL | Status: DC | PRN

## 2020-05-08 MED ORDER — MAGNESIUM CITRATE PO SOLN: 1.0000 | Freq: Once | ORAL | Status: DC | PRN

## 2020-05-08 MED ORDER — SODIUM CHLORIDE 0.9 % IV SOLN
INTRAVENOUS | Status: DC | PRN
  Administered 2020-05-08: 60 mL

## 2020-05-08 MED ORDER — BUPIVACAINE LIPOSOME 1.3 % IJ SUSP
INTRAMUSCULAR | Status: AC
  Filled 2020-05-08: qty 20

## 2020-05-08 MED ORDER — METOCLOPRAMIDE HCL 10 MG PO TABS
5.0000 mg | ORAL_TABLET | Freq: Three times a day (TID) | ORAL | Status: DC | PRN
  Administered 2020-05-09 – 2020-05-10 (×2): 10 mg via ORAL
  Filled 2020-05-08 (×2): qty 1

## 2020-05-08 MED ORDER — DIPHENHYDRAMINE HCL 12.5 MG/5ML PO ELIX: 12.5000 mg | ORAL_SOLUTION | ORAL | Status: DC | PRN

## 2020-05-08 MED ORDER — PHENYLEPHRINE HCL (PRESSORS) 10 MG/ML IV SOLN
INTRAVENOUS | Status: DC | PRN
  Administered 2020-05-08: 100 ug via INTRAVENOUS
  Administered 2020-05-08 (×4): 200 ug via INTRAVENOUS

## 2020-05-08 MED ORDER — ACETAMINOPHEN 10 MG/ML IV SOLN
INTRAVENOUS | Status: AC
  Filled 2020-05-08: qty 100

## 2020-05-08 MED ORDER — BUPIVACAINE HCL (PF) 0.5 % IJ SOLN
INTRAMUSCULAR | Status: DC | PRN
  Administered 2020-05-08: 2.8 mL

## 2020-05-08 MED ORDER — PROPOFOL 10 MG/ML IV BOLUS
INTRAVENOUS | Status: AC
  Filled 2020-05-08: qty 20

## 2020-05-08 MED ORDER — ONDANSETRON HCL 4 MG PO TABS
4.0000 mg | ORAL_TABLET | Freq: Four times a day (QID) | ORAL | Status: DC | PRN
  Administered 2020-05-10: 4 mg via ORAL
  Filled 2020-05-08: qty 1

## 2020-05-08 MED ORDER — FENTANYL CITRATE (PF) 100 MCG/2ML IJ SOLN: 25.0000 ug | INTRAMUSCULAR | Status: DC | PRN

## 2020-05-08 MED ORDER — BUPIVACAINE-EPINEPHRINE 0.25% -1:200000 IJ SOLN
INTRAMUSCULAR | Status: DC | PRN
  Administered 2020-05-08: 30 mL

## 2020-05-08 MED ORDER — GLYCOPYRROLATE 0.2 MG/ML IJ SOLN
INTRAMUSCULAR | Status: AC
  Filled 2020-05-08: qty 1

## 2020-05-08 MED ORDER — CEFAZOLIN SODIUM-DEXTROSE 2-4 GM/100ML-% IV SOLN
2.0000 g | Freq: Four times a day (QID) | INTRAVENOUS | Status: DC
  Filled 2020-05-08 (×2): qty 100

## 2020-05-08 MED ORDER — ACETAMINOPHEN 325 MG PO TABS: 325.0000 mg | ORAL_TABLET | Freq: Four times a day (QID) | ORAL | Status: DC | PRN

## 2020-05-08 MED ORDER — BUPIVACAINE HCL (PF) 0.5 % IJ SOLN
INTRAMUSCULAR | Status: AC
  Filled 2020-05-08: qty 10

## 2020-05-08 MED ORDER — SODIUM CHLORIDE 0.9 % IV SOLN: INTRAVENOUS | Status: DC

## 2020-05-08 MED ORDER — MORPHINE SULFATE (PF) 2 MG/ML IV SOLN: 0.5000 mg | INTRAVENOUS | Status: DC | PRN

## 2020-05-08 MED ORDER — CEFAZOLIN SODIUM-DEXTROSE 2-4 GM/100ML-% IV SOLN
2.0000 g | Freq: Four times a day (QID) | INTRAVENOUS | Status: AC
  Administered 2020-05-09: 2 g via INTRAVENOUS
  Filled 2020-05-08 (×3): qty 100

## 2020-05-08 MED ORDER — MENTHOL 3 MG MT LOZG: 1.0000 | LOZENGE | OROMUCOSAL | Status: DC | PRN

## 2020-05-08 SURGICAL SUPPLY — 59 items
BLADE SAGITTAL AGGR TOOTH XLG (BLADE) ×3 IMPLANT
BNDG COHESIVE 6X5 TAN STRL LF (GAUZE/BANDAGES/DRESSINGS) ×9 IMPLANT
CANISTER SUCT 1200ML W/VALVE (MISCELLANEOUS) ×3 IMPLANT
CANISTER WOUND CARE 500ML ATS (WOUND CARE) ×3 IMPLANT
CHLORAPREP W/TINT 26 (MISCELLANEOUS) ×3 IMPLANT
COVER BACK TABLE REUSABLE LG (DRAPES) ×3 IMPLANT
COVER WAND RF STERILE (DRAPES) ×3 IMPLANT
CUP ACETAB VERSA DBL 28X58 DMI (Orthopedic Implant) ×3 IMPLANT
DRAPE 3/4 80X56 (DRAPES) ×9 IMPLANT
DRAPE C-ARM XRAY 36X54 (DRAPES) ×3 IMPLANT
DRAPE INCISE IOBAN 66X60 STRL (DRAPES) IMPLANT
DRAPE POUCH INSTRU U-SHP 10X18 (DRAPES) ×3 IMPLANT
DRESSING SURGICEL FIBRLLR 1X2 (HEMOSTASIS) ×2 IMPLANT
DRSG OPSITE POSTOP 4X8 (GAUZE/BANDAGES/DRESSINGS) ×6 IMPLANT
DRSG SURGICEL FIBRILLAR 1X2 (HEMOSTASIS) ×6
ELECT BLADE 6.5 EXT (BLADE) ×3 IMPLANT
ELECT REM PT RETURN 9FT ADLT (ELECTROSURGICAL) ×3
ELECTRODE REM PT RTRN 9FT ADLT (ELECTROSURGICAL) ×1 IMPLANT
GLOVE BIOGEL PI IND STRL 9 (GLOVE) ×1 IMPLANT
GLOVE BIOGEL PI INDICATOR 9 (GLOVE) ×2
GLOVE SURG SYN 9.0  PF PI (GLOVE) ×4
GLOVE SURG SYN 9.0 PF PI (GLOVE) ×2 IMPLANT
GOWN SRG 2XL LVL 4 RGLN SLV (GOWNS) ×1 IMPLANT
GOWN STRL NON-REIN 2XL LVL4 (GOWNS) ×2
GOWN STRL REUS W/ TWL LRG LVL3 (GOWN DISPOSABLE) ×1 IMPLANT
GOWN STRL REUS W/TWL LRG LVL3 (GOWN DISPOSABLE) ×2
HEMOVAC 400CC 10FR (MISCELLANEOUS) IMPLANT
HIP FEM HD S 28 (Head) ×3 IMPLANT
HOLDER FOLEY CATH W/STRAP (MISCELLANEOUS) ×3 IMPLANT
HOOD PEEL AWAY FLYTE STAYCOOL (MISCELLANEOUS) ×3 IMPLANT
KIT PREVENA INCISION MGT 13 (CANNISTER) ×3 IMPLANT
MAT ABSORB  FLUID 56X50 GRAY (MISCELLANEOUS) ×2
MAT ABSORB FLUID 56X50 GRAY (MISCELLANEOUS) ×1 IMPLANT
NDL SAFETY ECLIPSE 18X1.5 (NEEDLE) ×1 IMPLANT
NEEDLE HYPO 18GX1.5 SHARP (NEEDLE) ×2
NEEDLE SPNL 20GX3.5 QUINCKE YW (NEEDLE) ×6 IMPLANT
NS IRRIG 1000ML POUR BTL (IV SOLUTION) ×3 IMPLANT
PACK HIP COMPR (MISCELLANEOUS) ×3 IMPLANT
SCALPEL PROTECTED #10 DISP (BLADE) ×6 IMPLANT
SHELL ACETABULAR SZ0 58MM (Shell) ×3 IMPLANT
SOL PREP PVP 2OZ (MISCELLANEOUS) ×3
SOLUTION PREP PVP 2OZ (MISCELLANEOUS) ×1 IMPLANT
SPONGE DRAIN TRACH 4X4 STRL 2S (GAUZE/BANDAGES/DRESSINGS) ×3 IMPLANT
STAPLER SKIN PROX 35W (STAPLE) ×3 IMPLANT
STEM FEMORAL SZ8 STD COLLARED (Stem) ×3 IMPLANT
STRAP SAFETY 5IN WIDE (MISCELLANEOUS) ×3 IMPLANT
SUT DVC 2 QUILL PDO  T11 36X36 (SUTURE) ×2
SUT DVC 2 QUILL PDO T11 36X36 (SUTURE) ×1 IMPLANT
SUT SILK 0 (SUTURE) ×2
SUT SILK 0 30XBRD TIE 6 (SUTURE) ×1 IMPLANT
SUT V-LOC 90 ABS DVC 3-0 CL (SUTURE) ×3 IMPLANT
SUT VIC AB 1 CT1 36 (SUTURE) ×3 IMPLANT
SYR 20ML LL LF (SYRINGE) ×3 IMPLANT
SYR 30ML LL (SYRINGE) ×3 IMPLANT
SYR 50ML LL SCALE MARK (SYRINGE) ×6 IMPLANT
SYR BULB IRRIG 60ML STRL (SYRINGE) ×3 IMPLANT
TAPE MICROFOAM 4IN (TAPE) ×3 IMPLANT
TOWEL OR 17X26 4PK STRL BLUE (TOWEL DISPOSABLE) ×3 IMPLANT
TRAY FOLEY MTR SLVR 16FR STAT (SET/KITS/TRAYS/PACK) ×3 IMPLANT

## 2020-05-08 NOTE — Anesthesia Preprocedure Evaluation (Signed)
Anesthesia Evaluation  Patient identified by MRN, date of birth, ID band Patient awake    Reviewed: Allergy & Precautions, NPO status , Patient's Chart, lab work & pertinent test results  History of Anesthesia Complications Negative for: history of anesthetic complications  Airway Mallampati: III  TM Distance: >3 FB Neck ROM: Full    Dental  (+) Caps   Pulmonary neg sleep apnea, neg COPD, former smoker,    breath sounds clear to auscultation- rhonchi (-) wheezing      Cardiovascular hypertension, Pt. on medications (-) CAD, (-) Past MI, (-) Cardiac Stents and (-) CABG  Rhythm:Regular Rate:Normal - Systolic murmurs and - Diastolic murmurs    Neuro/Psych neg Seizures Anxiety CVA, No Residual Symptoms    GI/Hepatic negative GI ROS, Neg liver ROS,   Endo/Other  diabetes, Oral Hypoglycemic Agents  Renal/GU Renal InsufficiencyRenal disease     Musculoskeletal R hip fracture   Abdominal (+) - obese,   Peds  Hematology negative hematology ROS (+)   Anesthesia Other Findings Past Medical History: No date: Hyperlipidemia No date: Hypertension   Reproductive/Obstetrics                             Lab Results  Component Value Date   WBC 10.5 05/08/2020   HGB 13.3 05/08/2020   HCT 39.4 05/08/2020   MCV 94.5 05/08/2020   PLT 148 (L) 05/08/2020    Anesthesia Physical Anesthesia Plan  ASA: III  Anesthesia Plan: Spinal   Post-op Pain Management:    Induction:   PONV Risk Score and Plan: 1 and Propofol infusion  Airway Management Planned: Natural Airway  Additional Equipment:   Intra-op Plan:   Post-operative Plan:   Informed Consent: I have reviewed the patients History and Physical, chart, labs and discussed the procedure including the risks, benefits and alternatives for the proposed anesthesia with the patient or authorized representative who has indicated his/her understanding  and acceptance.     Dental advisory given  Plan Discussed with: CRNA and Anesthesiologist  Anesthesia Plan Comments:         Anesthesia Quick Evaluation

## 2020-05-08 NOTE — Plan of Care (Signed)
  Problem: Education: Goal: Knowledge of General Education information will improve Description: Including pain rating scale, medication(s)/side effects and non-pharmacologic comfort measures Outcome: Progressing   Problem: Pain Managment: Goal: General experience of comfort will improve Outcome: Progressing   Problem: Safety: Goal: Ability to remain free from injury will improve Outcome: Progressing   

## 2020-05-08 NOTE — Consult Note (Signed)
Reason for Consult: Displaced femoral neck fracture left Referring Physician: Dr. Everlene Other is an 79 y.o. male.  HPI: Patient suffered a fall as noted per HPI and is generally active he goes to the gym or [employer] daily to exercise.  No prodromal symptoms.  Walks without assistive device in the community ambulator.  For the summer he is walking about 1/2 mile behind his house.  Past Medical History:  Diagnosis Date  . Hyperlipidemia   . Hypertension     Past Surgical History:  Procedure Laterality Date  . HERNIA REPAIR  2013    Family History  Problem Relation Age of Onset  . Prostate cancer Neg Hx   . Bladder Cancer Neg Hx     Social History:  reports that he quit smoking about 20 years ago. He has never used smokeless tobacco. He reports current alcohol use. He reports that he does not use drugs.  Allergies:  Allergies  Allergen Reactions  . Amlodipine Other (See Comments)    Irritation of gums  . Metoprolol Other (See Comments)    Fatigue   . Ramipril Nausea Only  . Triamterene-Hctz Other (See Comments)    Weakness   . Valsartan Other (See Comments)    Irritation of lips    Medications: I have reviewed the patient's current medications.  Results for orders placed or performed during the hospital encounter of 05/07/20 (from the past 48 hour(s))  Basic metabolic panel     Status: Abnormal   Collection Time: 05/07/20  6:00 PM  Result Value Ref Range   Sodium 135 135 - 145 mmol/L   Potassium 3.7 3.5 - 5.1 mmol/L   Chloride 100 98 - 111 mmol/L   CO2 25 22 - 32 mmol/L   Glucose, Bld 158 (H) 70 - 99 mg/dL    Comment: Glucose reference range applies only to samples taken after fasting for at least 8 hours.   BUN 20 8 - 23 mg/dL   Creatinine, Ser 1.35 (H) 0.61 - 1.24 mg/dL   Calcium 8.4 (L) 8.9 - 10.3 mg/dL   GFR calc non Af Amer 50 (L) >60 mL/min   GFR calc Af Amer 58 (L) >60 mL/min   Anion gap 10 5 - 15    Comment: Performed at [facility],  [address] [place], [place] [address]  Protime-INR     Status: None   Collection Time: 05/07/20  6:00 PM  Result Value Ref Range   Prothrombin Time 13.1 11.4 - 15.2 seconds   INR 1.0 0.8 - 1.2    Comment: (NOTE) INR goal varies based on device and disease states. Performed at [facility], [address] [place], [place] [address]   CBC with Differential     Status: Abnormal   Collection Time: 05/07/20  6:00 PM  Result Value Ref Range   WBC 11.9 (H) 4.0 - 10.5 K/uL   RBC 4.66 4.22 - 5.81 MIL/uL   Hemoglobin 14.9 13.0 - 17.0 g/dL   HCT 44.5 39 - 52 %   MCV 95.5 80.0 - 100.0 fL   MCH 32.0 26.0 - 34.0 pg   MCHC 33.5 30.0 - 36.0 g/dL   RDW 13.2 11.5 - 15.5 %   Platelets 147 (L) 150 - 400 K/uL   nRBC 0.0 0.0 - 0.2 %   Neutrophils Relative % 89 %   Neutro Abs 10.7 (H) 1.7 - 7.7 K/uL   Lymphocytes Relative 4 %   Lymphs  Abs 0.5 (L) 0.7 - 4.0 K/uL   Monocytes Relative 6 %   Monocytes Absolute 0.7 0 - 1 K/uL   Eosinophils Relative 0 %   Eosinophils Absolute 0.0 0 - 0 K/uL   Basophils Relative 0 %   Basophils Absolute 0.0 0 - 0 K/uL   Immature Granulocytes 1 %   Abs Immature Granulocytes 0.06 0.00 - 0.07 K/uL    Comment: Performed at [facility], [address] [place], [place] [address]  Type and screen [place]     Status: None (Preliminary result)   Collection Time: 05/07/20  6:06 PM  Result Value Ref Range   ABO/RH(D) PENDING    Antibody Screen PENDING    Sample Expiration      05/10/2020,2359 Performed at [facility], [address] [place], [place] [address]   SARS Coronavirus 2 by RT PCR (hospital order, performed in [facility] hospital lab) Nasopharyngeal Nasopharyngeal Swab     Status: None   Collection Time: 05/07/20  6:06 PM   Specimen: Nasopharyngeal Swab  Result Value Ref Range   SARS Coronavirus 2 NEGATIVE NEGATIVE    Comment: (NOTE) SARS-CoV-2 target nucleic acids are NOT DETECTED.  The  SARS-CoV-2 RNA is generally detectable in upper and lower respiratory specimens during the acute phase of infection. The lowest concentration of SARS-CoV-2 viral copies this assay can detect is 250 copies / mL. A negative result does not preclude SARS-CoV-2 infection and should not be used as the sole basis for treatment or other patient management decisions.  A negative result may occur with improper specimen collection / handling, submission of specimen other than nasopharyngeal swab, presence of viral mutation(s) within the areas targeted by this assay, and inadequate number of viral copies (<250 copies / mL). A negative result must be combined with clinical observations, patient history, and epidemiological information.  Fact Sheet for Patients:   StrictlyIdeas.no  Fact Sheet for Healthcare Providers: BankingDealers.co.za  This test is not yet approved or  cleared by the [place] FDA and has been authorized for detection and/or diagnosis of SARS-CoV-2 by FDA under an Emergency Use Authorization (EUA).  This EUA will remain in effect (meaning this test can be used) for the duration of the COVID-19 declaration under Section 564(b)(1) of the Act, 21 U.S.C. section 360bbb-3(b)(1), unless the authorization is terminated or revoked sooner.  Performed at [facility], [address] [place], [place] [address]   Hepatic function panel     Status: None   Collection Time: 05/07/20  6:06 PM  Result Value Ref Range   Total Protein 7.9 6.5 - 8.1 g/dL   Albumin 4.1 3.5 - 5.0 g/dL   AST 28 15 - 41 U/L   ALT 25 0 - 44 U/L   Alkaline Phosphatase 54 38 - 126 U/L   Total Bilirubin 1.0 0.3 - 1.2 mg/dL   Bilirubin, Direct 0.1 0.0 - 0.2 mg/dL   Indirect Bilirubin 0.9 0.3 - 0.9 mg/dL    Comment: Performed at [facility], [address] [place], [place] [address]  Hemoglobin A1c     Status: None   Collection Time:  05/07/20  6:06 PM  Result Value Ref Range   Hgb A1c MFr Bld 5.5 4.8 - 5.6 %    Comment: (NOTE) Pre diabetes:          5.7%-6.4%  Diabetes:              >6.4%  Glycemic control  for   <7.0% adults with diabetes    Mean Plasma Glucose 111.15 mg/dL    Comment: Performed at [facility] [address] [place], [place] [address]  Troponin I (High Sensitivity)     Status: None   Collection Time: 05/07/20  6:06 PM  Result Value Ref Range   Troponin I (High Sensitivity) 6 <18 ng/L    Comment: (NOTE) Elevated high sensitivity troponin I (hsTnI) values and significant  changes across serial measurements may suggest ACS but many other  chronic and acute conditions are known to elevate hsTnI results.  Refer to the "Links" section for chest pain algorithms and additional  guidance. Performed at [facility], [address] [place], [place] [address]   Glucose, capillary     Status: Abnormal   Collection Time: 05/07/20  8:16 PM  Result Value Ref Range   Glucose-Capillary 112 (H) 70 - 99 mg/dL    Comment: Glucose reference range applies only to samples taken after fasting for at least 8 hours.  Troponin I (High Sensitivity)     Status: None   Collection Time: 05/07/20  9:32 PM  Result Value Ref Range   Troponin I (High Sensitivity) 9 <18 ng/L    Comment: (NOTE) Elevated high sensitivity troponin I (hsTnI) values and significant  changes across serial measurements may suggest ACS but many other  chronic and acute conditions are known to elevate hsTnI results.  Refer to the "Links" section for chest pain algorithms and additional  guidance. Performed at [facility], [address] [place], [place] [address]   Type and screen     Status: None   Collection Time: 05/07/20  9:44 PM  Result Value Ref Range   ABO/RH(D) A POS    Antibody Screen NEG    Sample Expiration      05/10/2020,2359 Performed at [facility], [address] [place],  [place] [address]   Glucose, capillary     Status: Abnormal   Collection Time: 05/08/20 12:49 AM  Result Value Ref Range   Glucose-Capillary 119 (H) 70 - 99 mg/dL    Comment: Glucose reference range applies only to samples taken after fasting for at least 8 hours.  Glucose, capillary     Status: Abnormal   Collection Time: 05/08/20  3:14 AM  Result Value Ref Range   Glucose-Capillary 123 (H) 70 - 99 mg/dL    Comment: Glucose reference range applies only to samples taken after fasting for at least 8 hours.  MRSA PCR Screening     Status: None   Collection Time: 05/08/20  3:18 AM   Specimen: Nasal Mucosa; Nasopharyngeal  Result Value Ref Range   MRSA by PCR NEGATIVE NEGATIVE    Comment:        The GeneXpert MRSA Assay (FDA approved for NASAL specimens only), is one component of a comprehensive MRSA colonization surveillance program. It is not intended to diagnose MRSA infection nor to guide or monitor treatment for MRSA infections. Performed at [facility], [address] [place], [place] [address]   CBC     Status: Abnormal   Collection Time: 05/08/20  4:45 AM  Result Value Ref Range   WBC 10.5 4.0 - 10.5 K/uL   RBC 4.17 (L) 4.22 - 5.81 MIL/uL   Hemoglobin 13.3 13.0 - 17.0 g/dL   HCT 39.4 39 - 52 %   MCV 94.5 80.0 - 100.0 fL   MCH 31.9 26.0 - 34.0 pg  MCHC 33.8 30.0 - 36.0 g/dL   RDW 13.0 11.5 - 15.5 %   Platelets 148 (L) 150 - 400 K/uL   nRBC 0.0 0.0 - 0.2 %    Comment: Performed at [facility], [address] [place], [place] [address]  Basic metabolic panel     Status: Abnormal   Collection Time: 05/08/20  4:45 AM  Result Value Ref Range   Sodium 136 135 - 145 mmol/L   Potassium 3.7 3.5 - 5.1 mmol/L   Chloride 100 98 - 111 mmol/L   CO2 25 22 - 32 mmol/L   Glucose, Bld 120 (H) 70 - 99 mg/dL    Comment: Glucose reference range applies only to samples taken after fasting for at least 8 hours.   BUN 22 8 - 23 mg/dL   Creatinine, Ser 1.18 0.61 - 1.24  mg/dL   Calcium 8.2 (L) 8.9 - 10.3 mg/dL   GFR calc non Af Amer 59 (L) >60 mL/min   GFR calc Af Amer >60 >60 mL/min   Anion gap 11 5 - 15    Comment: Performed at [facility], [address] [place], [place] [address]    DG Chest 1 View  Result Date: 05/07/2020 CLINICAL DATA:  Mechanical fall at home EXAM: CHEST  1 VIEW COMPARISON:  None. FINDINGS: Mildly low lung volumes. No focal opacity or pleural effusion. Borderline cardiomegaly with aortic atherosclerosis. No pneumothorax. IMPRESSION: No active disease. Borderline cardiomegaly. Electronically Signed   By: Donavan Foil M.D.   On: 05/07/2020 17:37   DG Hip Unilat With Pelvis 2-3 Views Left  Result Date: 05/07/2020 CLINICAL DATA:  Fall.  Decreased range of motion. EXAM: DG HIP (WITH OR WITHOUT PELVIS) 2-3V LEFT COMPARISON:  None. FINDINGS: Minimally comminuted subcapital left femur fracture with varus angulation and lateral displacement of distal fracture fragments. Impaction. No dislocation. Sacroiliac joints are symmetric. Surgical clips along the left pelvic sidewall. IMPRESSION: Impacted left femoral neck fracture as detailed above. Electronically Signed   By: Abigail Miyamoto M.D.   On: 05/07/2020 17:37    Review of Systems Blood pressure (!) 149/74, pulse 84, temperature 98.4 F (36.9 C), temperature source Oral, resp. rate 16, height 6\' 1"  (1.854 m), weight 88.5 kg, SpO2 91 %. Physical Exam Left leg is shortened and externally rotated and neurovascular intact distally X-rays show mild degenerative change with completely displaced and comminuted femoral neck fracture Assessment/Plan: Active 79 year old with displaced femoral neck fracture Based on his activity level I recommend total hip to minimize long-term pain.  Risk benefits possible complications were discussed.  Plan on surgery today  Hessie Knows 05/08/2020, 7:15 AM

## 2020-05-08 NOTE — Transfer of Care (Signed)
Immediate Anesthesia Transfer of Care Note  Patient: Lonnie Clarke  Procedure(s) Performed: ARTHROPLASTY BIPOLAR HIP (HEMIARTHROPLASTY) (Left Hip)  Patient Location: PACU  Anesthesia Type:General  Level of Consciousness: drowsy  Airway & Oxygen Therapy: Patient Spontanous Breathing and Patient connected to face mask oxygen  Post-op Assessment: Report given to RN and Post -op Vital signs reviewed and stable  Post vital signs: Reviewed and stable  Last Vitals:  Vitals Value Taken Time  BP 100/58 05/08/20 1137  Temp 36.2 C 05/08/20 1137  Pulse 86 05/08/20 1145  Resp 22 05/08/20 1145  SpO2 97 % 05/08/20 1145  Vitals shown include unvalidated device data.  Last Pain:  Vitals:   05/08/20 1137  TempSrc:   PainSc: 0-No pain      Patients Stated Pain Goal: 2 (85/90/93 1121)  Complications: No complications documented.

## 2020-05-08 NOTE — Anesthesia Procedure Notes (Addendum)
Spinal  Patient location during procedure: OR Start time: 05/08/2020 10:04 AM End time: 05/08/2020 10:10 AM Staffing Performed: resident/CRNA  Anesthesiologist: Emmie Niemann, MD Resident/CRNA: Lia Foyer, CRNA Preanesthetic Checklist Completed: patient identified, IV checked, site marked, risks and benefits discussed, surgical consent, monitors and equipment checked, pre-op evaluation and timeout performed Spinal Block Patient position: right lateral decubitus Prep: ChloraPrep Patient monitoring: heart rate, continuous pulse ox and blood pressure Approach: midline Location: L3-4 Injection technique: single-shot Needle Needle type: Pencil-Tip and Introducer  Needle gauge: 24 G Needle length: 9 cm Assessment Sensory level: T4

## 2020-05-08 NOTE — Evaluation (Signed)
Physical Therapy Evaluation Patient Details Name: Lonnie Clarke MRN: [id number] DOB: 1941-05-24 Today's Date: 05/08/2020   History of Present Illness  79 y.o. male with a history of hyperlipidemia hypertension diabetes who comes the ED complaining of left hip pain after a mechanical fall while carrying boxes up steps and hitting door frame, falling backwards. 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 Patient is usually active and exercises at the gym frequently   Clinical Impression  PT evaluation complete. Patient is pleasant and cooperative with supportive family at bedside. Patient was independent and active prior to admission without assistive device. Patient needs Min A for bed mobility and Min A for sit to stand transfers with cues for technique. Patient able to stand without loss of balance with stand by assistance using rolling walker. Patient able to participate with strengthening therapeutic exercises in bed. Recommend PT to address functional limitations listed below to maximize independence in preparation for discharge home with family support. Recommend HHPT and rolling walker with 5" wheels at discharge.     Follow Up Recommendations Home health PT    Equipment Recommendations  Rolling walker with 5" wheels    Recommendations for Other Services       Precautions / Restrictions Precautions Precautions: Fall;Anterior Hip Precaution Booklet Issued: Yes (comment) Restrictions Weight Bearing Restrictions: Yes LLE Weight Bearing: Weight bearing as tolerated      Mobility  Bed Mobility Overal bed mobility: Needs Assistance Bed Mobility: Supine to Sit;Sit to Supine     Supine to sit: Min assist Sit to supine: Min assist   General bed mobility comments: assistance for trunk support to sit upright and assistance for LLE to return to bed. verbal cues for technique   Transfers Overall transfer level: Needs assistance Equipment used: Rolling walker (2  wheeled) Transfers: Sit to/from Stand Sit to Stand: Min assist         General transfer comment: verbal cues for technique   Ambulation/Gait             General Gait Details: did not progress ambulation this session   Stairs            Wheelchair Mobility    Modified Rankin (Stroke Patients Only)       Balance Overall balance assessment: Needs assistance Sitting-balance support: Feet supported Sitting balance-Leahy Scale: Good Sitting balance - Comments: static and dyanmic balance    Standing balance support: Bilateral upper extremity supported Standing balance-Leahy Scale: Fair Standing balance comment: patient relying heavily on rolling walker for support                              Pertinent Vitals/Pain Pain Location: mild pain reported in left hip in standing position only, otherwise no pain reported  Pain Descriptors / Indicators: Sore    Home Living Family/patient expects to be discharged to:: Private residence Living Arrangements: Spouse/significant other Available Help at Discharge: Available 24 hours/day;Family Type of Home: House Home Access: Stairs to enter Entrance Stairs-Rails: None Entrance Stairs-Number of Steps: 2 Home Layout: One level Home Equipment: Walker - 4 wheels;Cane - single point;Bedside commode (adjustable bed )      Prior Function Level of Independence: Independent               Hand Dominance        Extremity/Trunk Assessment   Upper Extremity Assessment Upper Extremity Assessment: Overall WFL for tasks assessed    Lower  Extremity Assessment Lower Extremity Assessment: RLE deficits/detail;LLE deficits/detail RLE Deficits / Details: strength WFL. patient able to complete 10 SLR without difficulty. patient reports tingling in the bottom of the foot but is able to detect light touch and can feel the foot on the floor  RLE Sensation: WNL (see deficts detail ) LLE Deficits / Details: patient able to  activate hip/knee/ankle movement.  LLE Sensation: WNL       Communication   Communication: No difficulties  Cognition Arousal/Alertness: Awake/alert Behavior During Therapy: WFL for tasks assessed/performed Overall Cognitive Status: Within Functional Limits for tasks assessed                                        General Comments      Exercises Total Joint Exercises Ankle Circles/Pumps: AROM;Strengthening;Both;10 reps;Supine Quad Sets: AROM;Strengthening;Both;10 reps;Supine Heel Slides: AROM;Strengthening;Left;10 reps;Supine Hip ABduction/ADduction: AROM;Strengthening;Left;10 reps;Supine Straight Leg Raises: AROM;Strengthening;Left;10 reps;Supine Other Exercises Other Exercises: cues for technique    Assessment/Plan    PT Assessment Patient needs continued PT services  PT Problem List Decreased strength;Decreased activity tolerance;Decreased balance;Decreased mobility;Decreased range of motion;Decreased knowledge of use of DME;Decreased safety awareness;Decreased knowledge of precautions       PT Treatment Interventions DME instruction;Gait training;Stair training;Functional mobility training;Therapeutic activities;Therapeutic exercise;Balance training;Patient/family education    PT Goals (Current goals can be found in the Care Plan section)  Acute Rehab PT Goals Patient Stated Goal: to go home  PT Goal Formulation: With patient Time For Goal Achievement: 05/22/20 Potential to Achieve Goals: Good    Frequency BID   Barriers to discharge        Co-evaluation               AM-PAC PT "6 Clicks" Mobility  Outcome Measure Help needed turning from your back to your side while in a flat bed without using bedrails?: A Little Help needed moving from lying on your back to sitting on the side of a flat bed without using bedrails?: A Little Help needed moving to and from a bed to a chair (including a wheelchair)?: A Little Help needed standing up from  a chair using your arms (e.g., wheelchair or bedside chair)?: A Little Help needed to walk in hospital room?: A Little Help needed climbing 3-5 steps with a railing? : A Little 6 Click Score: 18    End of Session   Activity Tolerance: Patient tolerated treatment well Patient left: in bed;with call bell/phone within reach;with bed alarm set Nurse Communication:  (white board updated with mobility status ) PT Visit Diagnosis: Muscle weakness (generalized) (M62.81);Difficulty in walking, not elsewhere classified (R26.2)    Time: 0037-0488 PT Time Calculation (min) (ACUTE ONLY): 29 min   Charges:   PT Evaluation $PT Eval Moderate Complexity: 1 Mod PT Treatments $Therapeutic Exercise: 8-22 mins        Minna Merritts, PT, MPT   Percell Locus 05/08/2020, 3:55 PM

## 2020-05-08 NOTE — Progress Notes (Signed)
*  PRELIMINARY RESULTS* Echocardiogram 2D Echocardiogram has been performed.  Lonnie Clarke 05/08/2020, 2:29 PM

## 2020-05-08 NOTE — Progress Notes (Signed)
PROGRESS NOTE    Lonnie Clarke  CBJ:[id number] DOB: Apr 03, 1941 DOA: 05/07/2020 PCP: Ezequiel Kayser, MD    Brief Narrative:  Lonnie Clarke is a 79 y.o. male with medical history significant ofhyperlipidemia hypertension diabetes , PAD, CVA, HOH Presented with  Left hip pain after mechanical fall Was moving boxes and run into a door that knocked him backwards. He ha sudden severe pain in left hip. Unable to bear weight, no numbness no paresthesia No head injury no LOC On aspirin not on anticoagulationNo DOE, no Cp he goes to the gym everyday, he works out every day  Pt was not found for 1 hour he was sitting in the heat     Consultants:   orthopedics  Procedures:   Antimicrobials:    cefazolin   Subjective: Fells quizzy in the stomach. No abd pain or vomiting  Objective: Vitals:   05/08/20 1321 05/08/20 1347 05/08/20 1444 05/08/20 1703  BP: (!) 116/58 (!) 131/70 (!) 151/74 (!) 150/77  Pulse: 77 77 91 89  Resp: 17 18 17 19   Temp:  98.3 F (36.8 C) 98.7 F (37.1 C) 98.3 F (36.8 C)  TempSrc:  Oral Oral Oral  SpO2: 93% 95% 97% 98%  Weight:      Height:        Intake/Output Summary (Last 24 hours) at 05/08/2020 1709 Last data filed at 05/08/2020 1500 Gross per 24 hour  Intake 1721.24 ml  Output 700 ml  Net 1021.24 ml   Filed Weights   05/07/20 1520 05/08/20 0026  Weight: 88.5 kg 88.5 kg    Examination:  General exam: Appears calm and comfortable  Respiratory system: Clear to auscultation. Respiratory effort normal. Cardiovascular system: S1 & S2 heard, RRR. No JVD, murmurs, rubs, gallops or clicks. No pedal edema. Gastrointestinal system: Abdomen is nondistended, soft and nontender. Normal bowel sounds heard. Central nervous system: Alert and oriented. No focal neurological deficits. Extremities: no edema Skin: warm, dry Psychiatry: Judgement and insight appear normal. Mood & affect appropriate.     Data Reviewed: I have personally reviewed following  labs and imaging studies  CBC: Recent Labs  Lab 05/07/20 1800 05/08/20 0445  WBC 11.9* 10.5  NEUTROABS 10.7*  --   HGB 14.9 13.3  HCT 44.5 39.4  MCV 95.5 94.5  PLT 147* 160*   Basic Metabolic Panel: Recent Labs  Lab 05/07/20 1800 05/08/20 0445  NA 135 136  K 3.7 3.7  CL 100 100  CO2 25 25  GLUCOSE 158* 120*  BUN 20 22  CREATININE 1.35* 1.18  CALCIUM 8.4* 8.2*   GFR: Estimated Creatinine Clearance: 59.2 mL/min (by C-G formula based on SCr of 1.18 mg/dL). Liver Function Tests: Recent Labs  Lab 05/07/20 1806  AST 28  ALT 25  ALKPHOS 54  BILITOT 1.0  PROT 7.9  ALBUMIN 4.1   No results for input(s): LIPASE, AMYLASE in the last 168 hours. No results for input(s): AMMONIA in the last 168 hours. Coagulation Profile: Recent Labs  Lab 05/07/20 1800  INR 1.0   Cardiac Enzymes: No results for input(s): CKTOTAL, CKMB, CKMBINDEX, TROPONINI in the last 168 hours. BNP (last 3 results) No results for input(s): PROBNP in the last 8760 hours. HbA1C: Recent Labs    05/07/20 1806  HGBA1C 5.5   CBG: Recent Labs  Lab 05/08/20 0049 05/08/20 0314 05/08/20 0819 05/08/20 1140 05/08/20 1705  GLUCAP 119* 123* 123* 107* 179*   Lipid Profile: No results for input(s): CHOL, HDL, LDLCALC, TRIG, CHOLHDL,  LDLDIRECT in the last 72 hours. Thyroid Function Tests: No results for input(s): TSH, T4TOTAL, FREET4, T3FREE, THYROIDAB in the last 72 hours. Anemia Panel: No results for input(s): VITAMINB12, FOLATE, FERRITIN, TIBC, IRON, RETICCTPCT in the last 72 hours. Sepsis Labs: No results for input(s): PROCALCITON, LATICACIDVEN in the last 168 hours.  Recent Results (from the past 240 hour(s))  SARS Coronavirus 2 by RT PCR (hospital order, performed in [facility] hospital lab) Nasopharyngeal Nasopharyngeal Swab     Status: None   Collection Time: 05/07/20  6:06 PM   Specimen: Nasopharyngeal Swab  Result Value Ref Range Status   SARS Coronavirus 2 NEGATIVE NEGATIVE Final     Comment: (NOTE) SARS-CoV-2 target nucleic acids are NOT DETECTED.  The SARS-CoV-2 RNA is generally detectable in upper and lower respiratory specimens during the acute phase of infection. The lowest concentration of SARS-CoV-2 viral copies this assay can detect is 250 copies / mL. A negative result does not preclude SARS-CoV-2 infection and should not be used as the sole basis for treatment or other patient management decisions.  A negative result may occur with improper specimen collection / handling, submission of specimen other than nasopharyngeal swab, presence of viral mutation(s) within the areas targeted by this assay, and inadequate number of viral copies (<250 copies / mL). A negative result must be combined with clinical observations, patient history, and epidemiological information.  Fact Sheet for Patients:   StrictlyIdeas.no  Fact Sheet for Healthcare Providers: BankingDealers.co.za  This test is not yet approved or  cleared by the [place] FDA and has been authorized for detection and/or diagnosis of SARS-CoV-2 by FDA under an Emergency Use Authorization (EUA).  This EUA will remain in effect (meaning this test can be used) for the duration of the COVID-19 declaration under Section 564(b)(1) of the Act, 21 U.S.C. section 360bbb-3(b)(1), unless the authorization is terminated or revoked sooner.  Performed at [facility], [address] [place], [place] [address]   MRSA PCR Screening     Status: None   Collection Time: 05/08/20  3:18 AM   Specimen: Nasal Mucosa; Nasopharyngeal  Result Value Ref Range Status   MRSA by PCR NEGATIVE NEGATIVE Final    Comment:        The GeneXpert MRSA Assay (FDA approved for NASAL specimens only), is one component of a comprehensive MRSA colonization surveillance program. It is not intended to diagnose MRSA infection nor to guide or monitor treatment for MRSA  infections. Performed at [facility], [address] [place], [place] [address]          Radiology Studies: DG Chest 1 View  Result Date: 05/07/2020 CLINICAL DATA:  Mechanical fall at home EXAM: CHEST  1 VIEW COMPARISON:  None. FINDINGS: Mildly low lung volumes. No focal opacity or pleural effusion. Borderline cardiomegaly with aortic atherosclerosis. No pneumothorax. IMPRESSION: No active disease. Borderline cardiomegaly. Electronically Signed   By: Donavan Foil M.D.   On: 05/07/2020 17:37   DG HIP OPERATIVE UNILAT W OR W/O PELVIS LEFT  Result Date: 05/08/2020 CLINICAL DATA:  Total left hip replacement. EXAM: OPERATIVE LEFT HIP (WITH PELVIS IF PERFORMED) 3 VIEWS TECHNIQUE: Fluoroscopic spot image(s) were submitted for interpretation post-operatively. COMPARISON:  05/07/2020. FINDINGS: Total left hip replacement. Hardware intact. Anatomic alignment. Fluoroscopy time 0 minutes 30 seconds. Radiation dose 9.8 mGy. IMPRESSION: Total left hip replacement.  Hardware intact.  Anatomic alignment. Electronically Signed   By: Marcello Moores  Register   On: 05/08/2020 11:39   DG HIP  UNILAT W OR W/O PELVIS 2-3 VIEWS LEFT  Result Date: 05/08/2020 CLINICAL DATA:  Status post left hip replacement. EXAM: DG HIP (WITH OR WITHOUT PELVIS) 2-3V LEFT COMPARISON:  05/07/2020 FINDINGS: Sequelae of interval left total hip arthroplasty are identified. No dislocation or acute fracture is identified. Postoperative gas is noted in the surrounding soft tissues. Skin staples and a wound VAC are noted. Sequelae of previous left inguinal hernia repair are also again noted. IMPRESSION: Interval left total hip arthroplasty without evidence of acute osseous abnormality. Electronically Signed   By: Logan Bores M.D.   On: 05/08/2020 12:40   DG Hip Unilat With Pelvis 2-3 Views Left  Result Date: 05/07/2020 CLINICAL DATA:  Fall.  Decreased range of motion. EXAM: DG HIP (WITH OR WITHOUT PELVIS) 2-3V LEFT COMPARISON:  None.  FINDINGS: Minimally comminuted subcapital left femur fracture with varus angulation and lateral displacement of distal fracture fragments. Impaction. No dislocation. Sacroiliac joints are symmetric. Surgical clips along the left pelvic sidewall. IMPRESSION: Impacted left femoral neck fracture as detailed above. Electronically Signed   By: Abigail Miyamoto M.D.   On: 05/07/2020 17:37        Scheduled Meds: . Chlorhexidine Gluconate Cloth  6 each Topical Daily  . docusate sodium  100 mg Oral BID  . [START ON 05/09/2020] enoxaparin (LOVENOX) injection  40 mg Subcutaneous Q24H  . insulin aspart  0-9 Units Subcutaneous Q4H  . pravastatin  20 mg Oral q1800  . traMADol  50 mg Oral Q6H   Continuous Infusions: . sodium chloride 100 mL/hr at 05/08/20 1346  .  ceFAZolin (ANCEF) IV    . methocarbamol (ROBAXIN) IV      Assessment & Plan:   Active Problems:   Benign essential HTN   Pure hypercholesterolemia   Controlled type 2 diabetes mellitus without complication (HCC)   Hyperlipidemia   Closed left hip fracture (HCC)   AKI (acute kidney injury) ([place])   79 y.o. male with medical history significant ofhyperlipidemia hypertension diabetes , PAD, CVA, HOH admitted for Left hip fracture   . Closed left hip fracture ([place]) -  -present on admission Left hip replacement by Ortho today Dr. Rudene Christians PT saw patient recommended home health Pain management Bowel regimen  Abn . EKG Echo ordered pending. Asx.     . Benign essential HTN -stable continue home meds as bp elevates  . Hyperlipidemia - continue on statin PAD- resume aspirin when cleared by orthopedics Continue statin  AKI - will hold ACEi  Improved with ivf.  monitor  Dm2 -   A1c is 5/5  RISS ck fs   DVT prophylaxis: scd Code Status: Full Family Communication: None Disposition Plan: Back to previous home life Status is: Inpatient  Remains inpatient appropriate because:Ongoing diagnostic testing needed not appropriate  for outpatient work up   Dispo: The patient is from: Home              Anticipated d/c is to: Home               Anticipated d/c date is: 1 day              Patient currently is not medically stable to d/c. Needs HH PT            LOS: 1 day   Time spent: 35 min with >50% on coc    Nolberto Hanlon, MD [facility] Pager [phone]  If 7PM-7AM, please contact night-coverage [email or web address] Password [email or web address] 05/08/2020, 5:09 PM

## 2020-05-08 NOTE — Op Note (Signed)
05/08/2020  11:41 AM  PATIENT:  Lonnie Clarke  79 y.o. male  PRE-OPERATIVE DIAGNOSIS: Left HIP FRACTURE and osteoarthritis  POST-OPERATIVE DIAGNOSIS: Left HIP FRACTURE and osteoarthritis  PROCEDURE: Left total hip replacement  SURGEON: Laurene Footman, MD  ASSISTANTS: None  ANESTHESIA:   spinal  EBL:  Total I/O In: 800 [I.V.:700; IV Piggyback:100] Out: 700 [Urine:200; Blood:500]  BLOOD ADMINISTERED:none  DRAINS: Incisional wound VAC   LOCAL MEDICATIONS USED:  MARCAINE    and OTHER Exparel  SPECIMEN:  Source of Specimen:  Left femoral head  DISPOSITION OF SPECIMEN:  PATHOLOGY  COUNTS:  YES  TOURNIQUET:  * No tourniquets in log *  IMPLANTS: Medacta AMIS 8 standard stem with 58 mm Mpact DM cup and liner with metal S 28 mm head  DICTATION: .Dragon Dictation   The patient was brought to the operating room and after spinal anesthesia was obtained patient was placed on the operative table with the ipsilateral foot into the Medacta attachment, contralateral leg on a well-padded table. C-arm was brought in and preop template x-ray taken. After prepping and draping in usual sterile fashion appropriate patient identification and timeout procedures were completed. Anterior approach to the hip was obtained and centered over the greater trochanter and TFL muscle. The subcutaneous tissue was incised hemostasis being achieved by electrocautery. TFL fascia was incised and the muscle retracted laterally deep retractor placed. The lateral femoral circumflex vessels were identified and ligated. The anterior capsule was exposed and a capsulotomy performed. The neck was identified and a femoral neck cut carried out with a saw.  The fracture was vertical and the cut was made such that it was mainly lateral neck which was removed.  The head was removed without difficulty and showed mild degenerative changes to both femoral head and acetabulum. Reaming was carried out to 58 mm and a 58 mm cup trial gave  appropriate tightness to the acetabular component a 58 DM cup was impacted into position. The leg was then externally rotated and ischiofemoral and pubofemoral releases carried out. The femur was sequentially broached to a size 8, size 8 standard with S head trials were placed and the final components chosen. The 8 standard stem was inserted along with a metal S 28 mm head and 58 mm liner. The hip was reduced and was stable the wound was thoroughly irrigated with fibrillar placed along the posterior capsule and medial neck. The deep fascia ws closed using a heavy Quill after infiltration of 30 cc of quarter percent Sensorcaine with epinephrine mixed with Exparel .3-0 V-loc to close the skin with skin staples.  Incisional wound VAC applied and patient was sent to recovery in stable condition.   PLAN OF CARE: Continue as inpatient

## 2020-05-09 ENCOUNTER — Encounter: Payer: Self-pay | Admitting: Orthopedic Surgery

## 2020-05-09 DIAGNOSIS — D696 Thrombocytopenia, unspecified: Secondary | ICD-10-CM

## 2020-05-09 LAB — GLUCOSE, CAPILLARY
Glucose-Capillary: 151 mg/dL — ABNORMAL HIGH (ref 70–99)
Glucose-Capillary: 117 mg/dL — ABNORMAL HIGH (ref 70–99)
Glucose-Capillary: 130 mg/dL — ABNORMAL HIGH (ref 70–99)
Glucose-Capillary: 184 mg/dL — ABNORMAL HIGH (ref 70–99)
Glucose-Capillary: 149 mg/dL — ABNORMAL HIGH (ref 70–99)
Glucose-Capillary: 159 mg/dL — ABNORMAL HIGH (ref 70–99)
Glucose-Capillary: 111 mg/dL — ABNORMAL HIGH (ref 70–99)

## 2020-05-09 LAB — CBC
HCT: 30.8 % — ABNORMAL LOW (ref 39.0–52.0)
Hemoglobin: 10.1 g/dL — ABNORMAL LOW (ref 13.0–17.0)
MCH: 32.1 pg (ref 26.0–34.0)
MCHC: 32.8 g/dL (ref 30.0–36.0)
MCV: 97.8 fL (ref 80.0–100.0)
Platelets: 139 10*3/uL — ABNORMAL LOW (ref 150–400)
RBC: 3.15 MIL/uL — ABNORMAL LOW (ref 4.22–5.81)
RDW: 13.3 % (ref 11.5–15.5)
WBC: 14.8 10*3/uL — ABNORMAL HIGH (ref 4.0–10.5)
nRBC: 0 % (ref 0.0–0.2)

## 2020-05-09 LAB — BASIC METABOLIC PANEL
Anion gap: 7 (ref 5–15)
BUN: 28 mg/dL — ABNORMAL HIGH (ref 8–23)
CO2: 27 mmol/L (ref 22–32)
Calcium: 7.4 mg/dL — ABNORMAL LOW (ref 8.9–10.3)
Chloride: 100 mmol/L (ref 98–111)
Creatinine, Ser: 1.29 mg/dL — ABNORMAL HIGH (ref 0.61–1.24)
GFR calc Af Amer: >60 mL/min (ref >60)
GFR calc non Af Amer: 53 mL/min — ABNORMAL LOW (ref >60)
Glucose, Bld: 130 mg/dL — ABNORMAL HIGH (ref 70–99)
Potassium: 4.3 mmol/L (ref 3.5–5.1)
Sodium: 134 mmol/L — ABNORMAL LOW (ref 135–145)

## 2020-05-09 LAB — ECHOCARDIOGRAM COMPLETE
Height: 73 in
S' Lateral: 2.88 cm
Weight: 3120 oz

## 2020-05-09 NOTE — Progress Notes (Signed)
   Subjective: 1 Day Post-Op Procedure(s) (LRB): ARTHROPLASTY BIPOLAR HIP (HEMIARTHROPLASTY) (Left) Patient reports pain as mild.   Patient is well, and has had no acute complaints or problems Denies any CP, SOB, ABD pain. We will continue therapy today.  Plan is to go Home after hospital stay.  Objective: Vital signs in last 24 hours: Temp:  [96.8 F (36 C)-98.7 F (37.1 C)] 97.7 F (36.5 C) (07/28 0815) Pulse Rate:  [71-104] 74 (07/28 0815) Resp:  [12-20] 18 (07/28 0815) BP: (100-151)/(47-77) 134/66 (07/28 0815) SpO2:  [86 %-98 %] 92 % (07/28 0815)  Intake/Output from previous day: 07/27 0701 - 07/28 0700 In: 1841.2 [P.O.:120; I.V.:1619.4; IV Piggyback:101.9] Out: 1200 [Urine:700; Blood:500] Intake/Output this shift: No intake/output data recorded.  Recent Labs    05/07/20 1800 05/08/20 0445 05/09/20 0456  HGB 14.9 13.3 10.1*   Recent Labs    05/08/20 0445 05/09/20 0456  WBC 10.5 14.8*  RBC 4.17* 3.15*  HCT 39.4 30.8*  PLT 148* 139*   Recent Labs    05/08/20 0445 05/09/20 0456  NA 136 134*  K 3.7 4.3  CL 100 100  CO2 25 27  BUN 22 28*  CREATININE 1.18 1.29*  GLUCOSE 120* 130*  CALCIUM 8.2* 7.4*   Recent Labs    05/07/20 1800  INR 1.0    EXAM General - Patient is Alert, Appropriate and Oriented Extremity - Neurovascular intact Sensation intact distally Intact pulses distally Dorsiflexion/Plantar flexion intact No cellulitis present Compartment soft Dressing - dressing C/D/I and no drainage, Praveena intact without drainage Motor Function - intact, moving foot and toes well on exam.   Past Medical History:  Diagnosis Date  . Hyperlipidemia   . Hypertension     Assessment/Plan:   1 Day Post-Op Procedure(s) (LRB): ARTHROPLASTY BIPOLAR HIP (HEMIARTHROPLASTY) (Left) Active Problems:   Benign essential HTN   Pure hypercholesterolemia   Controlled type 2 diabetes mellitus without complication (HCC)   Hyperlipidemia   Closed left hip  fracture (HCC)   AKI (acute kidney injury) ([place])  Estimated body mass index is 25.73 kg/m as calculated from the following:   Height as of this encounter: 6\' 1"  (1.854 m).   Weight as of this encounter: 88.5 kg. Advance diet Up with therapy  Acute postop blood loss anemia-hemoglobin 10.1.  Recheck hemoglobin in the morning Pain well controlled Vital signs are stable Encourage incentive spirometer Work on bowel movement Care management to assist with discharge  DVT Prophylaxis - Lovenox, TED hose and SCDs Weight-Bearing as tolerated to left leg   T. Rachelle Hora, PA-C [place] 05/09/2020, 8:25 AM

## 2020-05-09 NOTE — Evaluation (Signed)
Occupational Therapy Evaluation Patient Details Name: Lonnie Clarke MRN: [id number] DOB: 1941/01/04 Today's Date: 05/09/2020    History of Present Illness 79 y.o. male with a history of hyperlipidemia hypertension diabetes who comes the ED complaining of left hip pain after a mechanical fall while carrying boxes up steps and hitting door frame, falling backwards. 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 Patient is usually active and exercises at the gym frequently    Clinical Impression   [name] was seen for OT evaluation this date, POD#1 from above surgery. Pt was independent in all ADLs prior to surgery, including exercising regularly. Pt is eager to return to PLOF with less pain and improved safety and independence. Pt presents to acute OT demonstrating impaired ADL performance and functional mobility 2/2 decreased LB access, functional strength/ROM/balance deficits, and decreased activity tolerance. Pt currently requires SBA don R sock seated EOB c increased time, MAX A don L sock. MOD I self-drinking/grooming tasks seated. MIN A + RW for BSC t/f. Following BSC t/f pt complained of dizziness/nausea - wife reports common for pt. Pt had bout of emesis seated on BSC: BP 94/59, MAP 68, HR 109 - RN notified, resolved c seated rest. Pt instructed in self care skills, falls prevention strategies, home/routines modifications, DME/AE for LB bathing and dressing tasks, and compression stocking mgt strategies. Pt would benefit from additional instruction in self care skills and techniques to help maintain precautions with or without assistive devices to support recall and carryover prior to discharge. Recommend HHOT upon discharge.      Follow Up Recommendations  Home health OT    Equipment Recommendations  3 in 1 bedside commode (if difficulty from standard toilet height )    Recommendations for Other Services       Precautions / Restrictions Precautions Precautions:  Fall;Anterior Hip Restrictions Weight Bearing Restrictions: Yes LLE Weight Bearing: Weight bearing as tolerated      Mobility Bed Mobility Overal bed mobility: Needs Assistance Bed Mobility: Supine to Sit     Supine to sit: Min assist     General bed mobility comments: assist for LLE mgmt   Transfers Overall transfer level: Needs assistance Equipment used: Rolling walker (2 wheeled) Transfers: Sit to/from Stand Sit to Stand: Min assist         General transfer comment: VCs for hand/LLE placement     Balance Overall balance assessment: Needs assistance Sitting-balance support: No upper extremity supported;Feet supported Sitting balance-Leahy Scale: Good     Standing balance support: Bilateral upper extremity supported Standing balance-Leahy Scale: Fair                             ADL either performed or assessed with clinical judgement   ADL Overall ADL's : Needs assistance/impaired                                       General ADL Comments: SBA don R sock seated EOB c increased time, MAX A don L sock. MOD I self-drinking/grooming tasks seated. MIN A + RW for [facility] t/f      Vision         Perception     Praxis      Pertinent Vitals/Pain Pain Assessment: Faces Faces Pain Scale: Hurts little more Pain Location: L hip Pain Descriptors / Indicators: Grimacing Pain Intervention(s): Limited  activity within patient's tolerance;Premedicated before session;Repositioned;Monitored during session     Hand Dominance     Extremity/Trunk Assessment Upper Extremity Assessment Upper Extremity Assessment: Overall WFL for tasks assessed   Lower Extremity Assessment Lower Extremity Assessment: LLE deficits/detail LLE Deficits / Details: Limited mobility /strength       Communication Communication Communication: No difficulties   Cognition Arousal/Alertness: Awake/alert Behavior During Therapy: WFL for tasks  assessed/performed Overall Cognitive Status: Within Functional Limits for tasks assessed                                     General Comments  Pt reports dizziness c mobility causing nausea - wife at bed side reports normal for him. Following BSC t/f pt had emesis, vitals seated: BP 94/59, MAP 68, HR 109. resoleved c seated rest - RN notified     Exercises Exercises: Other exercises Other Exercises Other Exercises: Pt and caregiver educated re: OT role, DME recs, d/c recs, falls prevention, ECS, safe RW technique Other Exercises: LBD, face washing, self-drinking, sup>sit, sit<>stand, sitting/standing balance/tolerance   Shoulder Instructions      Home Living Family/patient expects to be discharged to:: Private residence Living Arrangements: Spouse/significant other Available Help at Discharge: Available 24 hours/day;Family Type of Home: House Home Access: Stairs to enter [employer] of Steps: 2 Entrance Stairs-Rails: None (son putting in rails ) Home Layout: One level     Bathroom Shower/Tub: Occupational psychologist: [place]: Environmental consultant - 4 wheels;Cane - single point;Bedside commode (adjustable bed, BSC is not height adjustable )          Prior Functioning/Environment Level of Independence: Independent                 OT Problem List: Decreased strength;Decreased range of motion;Decreased activity tolerance;Impaired balance (sitting and/or standing);Decreased knowledge of use of DME or AE      OT Treatment/Interventions: Self-care/ADL training;Therapeutic exercise;Energy conservation;DME and/or AE instruction;Therapeutic activities;Patient/family education;Balance training    OT Goals(Current goals can be found in the care plan section) Acute Rehab OT Goals Patient Stated Goal: to go home  OT Goal Formulation: With patient/family Time For Goal Achievement: 05/23/20 Potential to Achieve Goals: Good ADL Goals Pt  Will Perform Grooming: with modified independence;sitting Pt Will Perform Lower Body Dressing: with caregiver independent in assisting;with min guard assist;sit to/from stand (c LRAD PRN) Pt Will Transfer to Toilet: with supervision;ambulating;regular height toilet (c LRAD PRN)  OT Frequency: Min 2X/week   Barriers to D/C: Inaccessible home environment          Co-evaluation              AM-PAC OT "6 Clicks" Daily Activity     Outcome Measure Help from another person eating meals?: None Help from another person taking care of personal grooming?: None Help from another person toileting, which includes using toliet, bedpan, or urinal?: A Little Help from another person bathing (including washing, rinsing, drying)?: A Lot Help from another person to put on and taking off regular upper body clothing?: None Help from another person to put on and taking off regular lower body clothing?: A Lot 6 Click Score: 19   End of Session Equipment Utilized During Treatment: Gait belt;Rolling walker Nurse Communication: Other (comment) (Nausea medication requested )  Activity Tolerance: Patient tolerated treatment well (Tx limited by nausea) Patient left: Other (comment) (Pt left seated  on [facility] c PT in room at end of session)  OT Visit Diagnosis: Unsteadiness on feet (R26.81);Other abnormalities of gait and mobility (R26.89)                Time: 8657-8469 OT Time Calculation (min): 30 min Charges:  OT General Charges $OT Visit: 1 Visit OT Evaluation $OT Eval Moderate Complexity: 1 Mod OT Treatments $Self Care/Home Management : 23-37 mins  Dessie Coma, M.S. OTR/L  05/09/20, 11:21 AM

## 2020-05-09 NOTE — TOC Initial Note (Signed)
Transition of Care [facility]) - Initial/Assessment Note    Patient Details  Name: Lonnie Clarke MRN: [id number] Date of Birth: 1941-04-26  Transition of Care [facility]) CM/SW Contact:    Su Hilt, RN Phone Number: 05/09/2020, 2:13 PM  Clinical Narrative:                  Met with the patient and his wife at the bedside, He lives at home with his wife, He has a rollator at home, needs a RW and a 3 in 1 He agrees to [facility] services and I se tup [facility] He has transportation and can afford his medications, He is up to date with his PCP no additional needs  Expected Discharge Plan: [place] Barriers to Discharge: Continued Medical Work up   Patient Goals and CMS Choice Patient states their goals for this hospitalization and ongoing recovery are:: go home      Expected Discharge Plan and Services Expected Discharge Plan: [place]   Discharge Planning Services: CM Consult   Living arrangements for the past 2 months: Single Family Home                 DME Arranged: Walker rolling, 3-N-1 DME Agency: [employer] Date DME Agency Contacted: 05/09/20 Time DME Agency Contacted: [phone] Representative spoke with at DME Agency: zack HH Arranged: PT [place]: [facility] (formerly [employer]) Date [place]: 05/09/20 Time [place]: [place] Representative spoke with at [place]: Helene Kelp  Prior Living Arrangements/Services Living arrangements for the past 2 months: [place] Lives with:: Spouse Patient language and need for interpreter reviewed:: Yes Do you feel safe going back to the place where you live?: Yes      Need for Family Participation in Patient Care: No (Comment) Care giver support system in place?: Yes (comment)   Criminal Activity/Legal Involvement Pertinent to Current Situation/Hospitalization: No - Comment as needed  Activities of Daily Living Home Assistive Devices/Equipment: Cane (specify  quad or straight), Crutches, Eyeglasses, Other (Comment) (adjustable bed) ADL Screening (condition at time of admission) Patient's cognitive ability adequate to safely complete daily activities?: Yes Is the patient deaf or have difficulty hearing?: Yes Does the patient have difficulty seeing, even when wearing glasses/contacts?: No Does the patient have difficulty concentrating, remembering, or making decisions?: No Patient able to express need for assistance with ADLs?: No Does the patient have difficulty dressing or bathing?: No Independently performs ADLs?: Yes (appropriate for developmental age) Does the patient have difficulty walking or climbing stairs?: No Weakness of Legs: None Weakness of Arms/Hands: None  Permission Sought/Granted   Permission granted to share information with : Yes, Verbal Permission Granted        Permission granted to share info w Relationship: [facility] [employer]     Emotional Assessment Appearance:: Appears stated age Attitude/Demeanor/Rapport: Engaged Affect (typically observed): Appropriate Orientation: : Oriented to Situation, Oriented to  Time, Oriented to Place, Oriented to Self Alcohol / Substance Use: Not Applicable Psych Involvement: No (comment)  Admission diagnosis:  Fall [W19.XXXA] Closed left hip fracture ([place]) [S72.002A] Subcapital fracture of left hip, closed, initial encounter ([place]) [S72.012A] Type 2 diabetes mellitus without complication, without long-term current use of insulin ([place]) [E11.9] Patient Active Problem List   Diagnosis Date Noted  . Closed left hip fracture ([place]) 05/07/2020  . AKI (acute kidney injury) ([place]) 05/07/2020  . Leg pain 09/07/2017  . Atherosclerosis of native arteries of extremity with  intermittent claudication ([place]) 07/07/2017  . Cerebrovascular accident (CVA) ([place]) 12/10/2015  . Microhematuria 12/10/2015  . Hyperlipidemia   . Controlled type 2 diabetes mellitus without complication ([place]) 88/91/6945  .  Other long term (current) drug therapy 11/19/2014  . Anxiety about health 05/19/2014  . Benign essential HTN 05/19/2014  . Pure hypercholesterolemia 05/19/2014   PCP:  Ezequiel Kayser, MD Pharmacy:   [facility] [place], [place] [facility] [address] [place] [address] Phone: [phone] Fax: [phone]     Social Determinants of Health (SDOH) Interventions    Readmission Risk Interventions No flowsheet data found.

## 2020-05-09 NOTE — Progress Notes (Signed)
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  PT recs home health   Abnormal EKG: echo shows EF 60-65%, indeterminate diastolic function, no wall motion abnormalities.   HTN: continue to hold quinapril secondary to AKI. Hydralazine prn   Hyperlipidemia: continue on statin  PAD: continue on statin, aspirin   AKI:  will continue to hold quinapril. Cr is trending up from day prior. Will continue to monitor   DM2: well controlled w/ HbA1c 5.5. Continue on SSI w/ accuchecks   Thrombocytopenia: etiology unclear. Will continue to monitor   Leukocytosis: likely secondary to recent surgery. Will continue to monitor   DVT prophylaxis: lovenox Code Status: full  Family Communication:  Disposition Plan: likely d/c home w/ home health    Consultants:   Ortho surg    Procedures:   Antimicrobials:  Subjective: Pt c/o left hip pain   Objective: Vitals:   05/08/20 1950 05/08/20 2316 05/09/20 0536 05/09/20 0815  BP: 118/71 (!) 141/73 (!) 113/63 (!) 134/66  Pulse: 104 86 74 74  Resp: 20 18 17 18   Temp: 98.6 F (37 C) 98.4 F (36.9 C) 98.2 F (36.8 C) 97.7 F (36.5 C)  TempSrc: Oral Oral Oral Oral  SpO2: 97% 98% 94% 92%  Weight:      Height:        Intake/Output Summary (Last 24 hours) at 05/09/2020 0909 Last data filed at 05/09/2020 0500 Gross per 24 hour  Intake 1841.24 ml  Output 1200 ml  Net 641.24 ml   Filed Weights   05/07/20 1520 05/08/20 0026  Weight: 88.5 kg 88.5 kg    Examination:  General exam: Appears calm and comfortable  Respiratory system: Clear to  auscultation. Respiratory effort normal. No wheezes  Cardiovascular system: S1 & S2 +. No rubs, gallops or clicks Gastrointestinal system: Abdomen is nondistended, soft and nontender. Normal bowel sounds heard. Central nervous system: Alert and oriented. Moves all for 4 extremities Psychiatry: Judgement and insight appear normal. Mood & affect appropriate.     Data Reviewed: I have personally reviewed following labs and imaging studies  CBC: Recent Labs  Lab 05/07/20 1800 05/08/20 0445 05/09/20 0456  WBC 11.9* 10.5 14.8*  NEUTROABS 10.7*  --   --   HGB 14.9 13.3 10.1*  HCT 44.5 39.4 30.8*  MCV 95.5 94.5 97.8  PLT 147* 148* 166*   Basic Metabolic Panel: Recent Labs  Lab 05/07/20 1800 05/08/20 0445 05/09/20 0456  NA 135 136 134*  K 3.7 3.7 4.3  CL 100 100 100  CO2 25 25 27   GLUCOSE 158* 120* 130*  BUN 20 22 28*  CREATININE 1.35* 1.18 1.29*  CALCIUM 8.4* 8.2* 7.4*   GFR: Estimated Creatinine Clearance: 54.2 mL/min (A) (by C-G formula based on SCr of 1.29 mg/dL (H)). Liver Function Tests: Recent Labs  Lab 05/07/20 1806  AST 28  ALT 25  ALKPHOS 54  BILITOT 1.0  PROT 7.9  ALBUMIN 4.1   No results for input(s): LIPASE, AMYLASE in the last 168 hours. No results for input(s): AMMONIA in the last 168 hours.  Coagulation Profile: Recent Labs  Lab 05/07/20 1800  INR 1.0   Cardiac Enzymes: No results for input(s): CKTOTAL, CKMB, CKMBINDEX, TROPONINI in the last 168 hours. BNP (last 3 results) No results for input(s): PROBNP in the last 8760 hours. HbA1C: Recent Labs    05/07/20 1806  HGBA1C 5.5   CBG: Recent Labs  Lab 05/08/20 1705 05/08/20 2041 05/09/20 0133 05/09/20 0500 05/09/20 0818  GLUCAP 179* 212* 151* 117* 130*   Lipid Profile: No results for input(s): CHOL, HDL, LDLCALC, TRIG, CHOLHDL, LDLDIRECT in the last 72 hours. Thyroid Function Tests: No results for input(s): TSH, T4TOTAL, FREET4, T3FREE, THYROIDAB in the last 72 hours. Anemia  Panel: No results for input(s): VITAMINB12, FOLATE, FERRITIN, TIBC, IRON, RETICCTPCT in the last 72 hours. Sepsis Labs: No results for input(s): PROCALCITON, LATICACIDVEN in the last 168 hours.  Recent Results (from the past 240 hour(s))  SARS Coronavirus 2 by RT PCR (hospital order, performed in [facility] hospital lab) Nasopharyngeal Nasopharyngeal Swab     Status: None   Collection Time: 05/07/20  6:06 PM   Specimen: Nasopharyngeal Swab  Result Value Ref Range Status   SARS Coronavirus 2 NEGATIVE NEGATIVE Final    Comment: (NOTE) SARS-CoV-2 target nucleic acids are NOT DETECTED.  The SARS-CoV-2 RNA is generally detectable in upper and lower respiratory specimens during the acute phase of infection. The lowest concentration of SARS-CoV-2 viral copies this assay can detect is 250 copies / mL. A negative result does not preclude SARS-CoV-2 infection and should not be used as the sole basis for treatment or other patient management decisions.  A negative result may occur with improper specimen collection / handling, submission of specimen other than nasopharyngeal swab, presence of viral mutation(s) within the areas targeted by this assay, and inadequate number of viral copies (<250 copies / mL). A negative result must be combined with clinical observations, patient history, and epidemiological information.  Fact Sheet for Patients:   StrictlyIdeas.no  Fact Sheet for Healthcare Providers: BankingDealers.co.za  This test is not yet approved or  cleared by the [place] FDA and has been authorized for detection and/or diagnosis of SARS-CoV-2 by FDA under an Emergency Use Authorization (EUA).  This EUA will remain in effect (meaning this test can be used) for the duration of the COVID-19 declaration under Section 564(b)(1) of the Act, 21 U.S.C. section 360bbb-3(b)(1), unless the authorization is terminated or revoked  sooner.  Performed at [facility], [address] [place], [place] [address]   MRSA PCR Screening     Status: None   Collection Time: 05/08/20  3:18 AM   Specimen: Nasal Mucosa; Nasopharyngeal  Result Value Ref Range Status   MRSA by PCR NEGATIVE NEGATIVE Final    Comment:        The GeneXpert MRSA Assay (FDA approved for NASAL specimens only), is one component of a comprehensive MRSA colonization surveillance program. It is not intended to diagnose MRSA infection nor to guide or monitor treatment for MRSA infections. Performed at [facility], [address] [place], [place] [address]          Radiology Studies: DG Chest 1 View  Result Date: 05/07/2020 CLINICAL DATA:  Mechanical fall at home EXAM: CHEST  1 VIEW COMPARISON:  None. FINDINGS: Mildly low lung volumes. No focal opacity or pleural effusion. Borderline cardiomegaly with aortic atherosclerosis. No pneumothorax. IMPRESSION: No active disease. Borderline cardiomegaly. Electronically Signed   By: Donavan Foil M.D.   On: 05/07/2020 17:37  ECHOCARDIOGRAM COMPLETE  Result Date: 05/09/2020    ECHOCARDIOGRAM REPORT   Patient Name:   FREDDERICK SWANGER Date of Exam: 05/08/2020 Medical Rec #:  [id number]      Height:       73.0 in Accession #:    [id number]     Weight:       195.0 lb Date of Birth:  27-Nov-1940      BSA:          2.129 m Patient Age:    79 years       BP:           131/70 mmHg Patient Gender: M              HR:           77 bpm. Exam Location:  [facility] Procedure: 2D Echo, Cardiac Doppler and Color Doppler Indications:     Abnormal ECG 794.31  History:         Patient has no prior history of Echocardiogram examinations.                  Risk Factors:Hypertension and Dyslipidemia.  Sonographer:     Sherrie Sport RDCS (AE) Referring Phys:  [place] Diagnosing Phys: Ida Rogue MD  Sonographer Comments: no apical window and no subcostal window. IMPRESSIONS  1. Left ventricular ejection  fraction, by estimation, is 60 to 65%. The left ventricle has normal function. The left ventricle has no regional wall motion abnormalities. Left ventricular diastolic parameters are indeterminate.  2. Right ventricular systolic function is normal. The right ventricular size is normal.  3. Challenging images FINDINGS  Left Ventricle: Left ventricular ejection fraction, by estimation, is 60 to 65%. The left ventricle has normal function. The left ventricle has no regional wall motion abnormalities. The left ventricular internal cavity size was normal in size. There is  no left ventricular hypertrophy. Left ventricular diastolic parameters are indeterminate. Right Ventricle: The right ventricular size is normal. No increase in right ventricular wall thickness. Right ventricular systolic function is normal. Left Atrium: Left atrial size was normal in size. Right Atrium: Right atrial size was normal in size. Pericardium: There is no evidence of pericardial effusion. Mitral Valve: The mitral valve is normal in structure. Normal mobility of the mitral valve leaflets. No evidence of mitral valve regurgitation. No evidence of mitral valve stenosis. There is no evidence of mitral valve vegetation. Tricuspid Valve: The tricuspid valve is normal in structure. Tricuspid valve regurgitation is not demonstrated. No evidence of tricuspid stenosis. There is no evidence of tricuspid valve vegetation. Aortic Valve: The aortic valve is normal in structure. Aortic valve regurgitation is not visualized. Mild aortic valve sclerosis is present, with no evidence of aortic valve stenosis. There is no evidence of aortic valve vegetation. Pulmonic Valve: The pulmonic valve was normal in structure. Pulmonic valve regurgitation is not visualized. No evidence of pulmonic stenosis. There is no evidence of pulmonic valve vegetation. Aorta: The aortic root is normal in size and structure. Venous: The pulmonary veins were not well visualized. The  inferior vena cava is normal in size with greater than 50% respiratory variability, suggesting right atrial pressure of 3 mmHg. IAS/Shunts: No atrial level shunt detected by color flow Doppler.  LEFT VENTRICLE PLAX 2D LVIDd:         4.72 cm LVIDs:         2.88 cm LV PW:         1.17 cm LV IVS:  1.27 cm LVOT diam:     2.10 cm LVOT Area:     3.46 cm  LEFT ATRIUM         Index LA diam:    3.30 cm 1.55 cm/m   AORTA Ao Root diam: 3.50 cm  SHUNTS Systemic Diam: 2.10 cm Ida Rogue MD Electronically signed by Ida Rogue MD Signature Date/Time: 05/09/2020/6:35:55 AM    Final    DG HIP OPERATIVE UNILAT W OR W/O PELVIS LEFT  Result Date: 05/08/2020 CLINICAL DATA:  Total left hip replacement. EXAM: OPERATIVE LEFT HIP (WITH PELVIS IF PERFORMED) 3 VIEWS TECHNIQUE: Fluoroscopic spot image(s) were submitted for interpretation post-operatively. COMPARISON:  05/07/2020. FINDINGS: Total left hip replacement. Hardware intact. Anatomic alignment. Fluoroscopy time 0 minutes 30 seconds. Radiation dose 9.8 mGy. IMPRESSION: Total left hip replacement.  Hardware intact.  Anatomic alignment. Electronically Signed   By: Marcello Moores  Register   On: 05/08/2020 11:39   DG HIP UNILAT W OR W/O PELVIS 2-3 VIEWS LEFT  Result Date: 05/08/2020 CLINICAL DATA:  Status post left hip replacement. EXAM: DG HIP (WITH OR WITHOUT PELVIS) 2-3V LEFT COMPARISON:  05/07/2020 FINDINGS: Sequelae of interval left total hip arthroplasty are identified. No dislocation or acute fracture is identified. Postoperative gas is noted in the surrounding soft tissues. Skin staples and a wound VAC are noted. Sequelae of previous left inguinal hernia repair are also again noted. IMPRESSION: Interval left total hip arthroplasty without evidence of acute osseous abnormality. Electronically Signed   By: Logan Bores M.D.   On: 05/08/2020 12:40   DG Hip Unilat With Pelvis 2-3 Views Left  Result Date: 05/07/2020 CLINICAL DATA:  Fall.  Decreased range of motion.  EXAM: DG HIP (WITH OR WITHOUT PELVIS) 2-3V LEFT COMPARISON:  None. FINDINGS: Minimally comminuted subcapital left femur fracture with varus angulation and lateral displacement of distal fracture fragments. Impaction. No dislocation. Sacroiliac joints are symmetric. Surgical clips along the left pelvic sidewall. IMPRESSION: Impacted left femoral neck fracture as detailed above. Electronically Signed   By: Abigail Miyamoto M.D.   On: 05/07/2020 17:37        Scheduled Meds: . Chlorhexidine Gluconate Cloth  6 each Topical Daily  . docusate sodium  100 mg Oral BID  . enoxaparin (LOVENOX) injection  40 mg Subcutaneous Q24H  . insulin aspart  0-9 Units Subcutaneous Q4H  . pravastatin  20 mg Oral q1800  . traMADol  50 mg Oral Q6H   Continuous Infusions: . sodium chloride 100 mL/hr at 05/09/20 0314  . methocarbamol (ROBAXIN) IV       LOS: 2 days    Time spent: 31 mins     Wyvonnia Dusky, MD [facility] Pager [phone]  If 7PM-7AM, please contact night-coverage [email or web address] 05/09/2020, 9:09 AM

## 2020-05-09 NOTE — Anesthesia Postprocedure Evaluation (Signed)
Anesthesia Post Note  Patient: Lonnie Clarke  Procedure(s) Performed: ARTHROPLASTY BIPOLAR HIP (HEMIARTHROPLASTY) (Left Hip)  Patient location during evaluation: Nursing Unit Anesthesia Type: Spinal Level of consciousness: awake, awake and alert and oriented Pain management: pain level controlled Vital Signs Assessment: post-procedure vital signs reviewed and stable Respiratory status: spontaneous breathing, nonlabored ventilation and respiratory function stable Cardiovascular status: blood pressure returned to baseline and stable Postop Assessment: no headache and no backache   No complications documented.   Last Vitals:  Vitals:   05/08/20 2316 05/09/20 0536  BP: (!) 141/73 (!) 113/63  Pulse: 86 74  Resp: 18 17  Temp: 36.9 C 36.8 C  SpO2: 98% 94%    Last Pain:  Vitals:   05/09/20 0600  TempSrc:   PainSc: Asleep                 Proofreader

## 2020-05-09 NOTE — Progress Notes (Signed)
Physical Therapy Treatment Patient Details Name: Lonnie Clarke MRN: [id number] DOB: Feb 14, 1941 Today's Date: 05/09/2020    History of Present Illness 79 y.o. male with a history of hyperlipidemia hypertension diabetes who comes the ED complaining of left hip pain after a mechanical fall while carrying boxes up steps and hitting door frame, falling backwards. 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 Patient is usually active and exercises at the gym frequently     PT Comments    Patient able to initiate OOB and short-distance gait training this date, min assist with RW for all mobility tasks.  Demonstrates step to gait pattern, heavy WBing bilat UEs; short, shuffling steps.  Very slow and deliberate, but no buckling or LOB.  Did report onset of dizziness with gait efforts (vitals stable and WFL); unable to progress distance as a result.  Will continue to progress gait distance in PM as appropriate. May consider full orthostatic assessment if symptoms persist.     Follow Up Recommendations  Home health PT     Equipment Recommendations  Rolling walker with 5" wheels;3in1 (PT)    Recommendations for Other Services       Precautions / Restrictions Precautions Precautions: Fall;Anterior Hip Restrictions Weight Bearing Restrictions: Yes LLE Weight Bearing: Weight bearing as tolerated    Mobility  Bed Mobility               General bed mobility comments: seated on BSC upon arrival to session; in recliner end of session  Transfers Overall transfer level: Needs assistance Equipment used: Rolling walker (2 wheeled) Transfers: Sit to/from Stand Sit to Stand: Min assist         General transfer comment: cuing for hand placement; decreased active use of L LE, tends to maintain anterior to BOS with movement transition  Ambulation/Gait Ambulation/Gait assistance: Min assist Gait Distance (Feet): 10 Feet Assistive device: Rolling walker (2 wheeled)        General Gait Details: step to gait pattern, heavy WBing bilat UEs; short, shuffling steps.  Very slow and deliberate, but no buckling or LOB.  Did report onset of dizziness with gait efforts; unable to progress distance as a result.   Stairs             Wheelchair Mobility    Modified Rankin (Stroke Patients Only)       Balance Overall balance assessment: Needs assistance Sitting-balance support: No upper extremity supported;Feet supported Sitting balance-Leahy Scale: Good     Standing balance support: Bilateral upper extremity supported Standing balance-Leahy Scale: Fair                              Cognition Arousal/Alertness: Awake/alert Behavior During Therapy: WFL for tasks assessed/performed Overall Cognitive Status: Within Functional Limits for tasks assessed                                        Exercises Other Exercises Other Exercises: Seated LE therex, 1x10, active ROM: ankle pumps, quad sets, LAQs, marching, iso hip adduct.  Guarded movement to L LE (due to pain), but improves with repetition.    General Comments        Pertinent Vitals/Pain Pain Assessment: Faces Faces Pain Scale: Hurts little more Pain Location: L hip Pain Descriptors / Indicators: Grimacing Pain Intervention(s): Limited activity within patient's tolerance;Monitored during session;Premedicated before  session;Repositioned    Home Living                      Prior Function            PT Goals (current goals can now be found in the care plan section) Acute Rehab PT Goals Patient Stated Goal: to go home  PT Goal Formulation: With patient Time For Goal Achievement: 05/22/20 Potential to Achieve Goals: Good Progress towards PT goals: Progressing toward goals    Frequency    BID      PT Plan Current plan remains appropriate    Co-evaluation              AM-PAC PT "6 Clicks" Mobility   Outcome Measure  Help needed  turning from your back to your side while in a flat bed without using bedrails?: A Little Help needed moving from lying on your back to sitting on the side of a flat bed without using bedrails?: A Little Help needed moving to and from a bed to a chair (including a wheelchair)?: A Little Help needed standing up from a chair using your arms (e.g., wheelchair or bedside chair)?: A Little Help needed to walk in hospital room?: A Little Help needed climbing 3-5 steps with a railing? : A Little 6 Click Score: 18    End of Session Equipment Utilized During Treatment: Gait belt Activity Tolerance: Patient tolerated treatment well Patient left: in chair;with call bell/phone within reach;with chair alarm set Nurse Communication: Mobility status PT Visit Diagnosis: Muscle weakness (generalized) (M62.81);Difficulty in walking, not elsewhere classified (R26.2);Pain Pain - Right/Left: Left Pain - part of body: Hip     Time: 0940-1000 PT Time Calculation (min) (ACUTE ONLY): 20 min  Charges:  $Therapeutic Activity: 8-22 mins                     Ixchel Duck H. Owens Shark, PT, DPT, NCS 05/09/20, 10:09 AM [phone]

## 2020-05-09 NOTE — Progress Notes (Signed)
Physical Therapy Treatment Patient Details Name: Lonnie Clarke MRN: [id number] DOB: 1941-05-28 Today's Date: 05/09/2020    History of Present Illness 79 y.o. male with a history of hyperlipidemia hypertension diabetes who comes the ED complaining of left hip pain after a mechanical fall while carrying boxes up steps and hitting door frame, falling backwards. 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 Patient is usually active and exercises at the gym frequently     PT Comments    Patient making gradual progress towards goals.  Modest increase in gait distance (15' x2), but fatigues quickly and requires intermittent seated rest breaks.  Continues to report mild/mod dizziness with gait efforts, but vitals assessed during session; appear stable and WFL with transition to upright.    Follow Up Recommendations  Home health PT     Equipment Recommendations  Rolling walker with 5" wheels;3in1 (PT)    Recommendations for Other Services       Precautions / Restrictions Precautions Precautions: Fall;Anterior Hip Restrictions Weight Bearing Restrictions: Yes LLE Weight Bearing: Weight bearing as tolerated    Mobility  Bed Mobility Overal bed mobility: Needs Assistance Bed Mobility: Sit to Supine       Sit to supine: Min assist   General bed mobility comments: for L LE elevation over edge of bed  Transfers Overall transfer level: Needs assistance Equipment used: Rolling walker (2 wheeled) Transfers: Sit to/from Stand Sit to Stand: Min guard         General transfer comment: very heavy use of UEs to assist with lift off, anterior weight translation; limited active use/WBing L LE  Ambulation/Gait Ambulation/Gait assistance: Min assist Gait Distance (Feet):  (15' x2) Assistive device: Rolling walker (2 wheeled)       General Gait Details: step to gait pattern, heavy WBing bilat UEs; short, shuffling steps. Fatigues quickly due to UE strength required.   Mild reports of "swimmy headedness" after 15' of gait; vitals stable and WFL   Stairs             Wheelchair Mobility    Modified Rankin (Stroke Patients Only)       Balance Overall balance assessment: Needs assistance Sitting-balance support: No upper extremity supported;Feet supported Sitting balance-Leahy Scale: Good     Standing balance support: Bilateral upper extremity supported Standing balance-Leahy Scale: Fair                              Cognition Arousal/Alertness: Awake/alert Behavior During Therapy: WFL for tasks assessed/performed Overall Cognitive Status: Within Functional Limits for tasks assessed                                        Exercises Other Exercises Other Exercises: Orthostatic assessment: seated BP 135/68, HR 82; standing BP 157/70, HR 116.  Asymptomatic    General Comments        Pertinent Vitals/Pain Pain Assessment: Faces Faces Pain Scale: Hurts little more Pain Location: L hip Pain Descriptors / Indicators: Grimacing;Sore Pain Intervention(s): Monitored during session;Repositioned;Limited activity within patient's tolerance    Home Living                      Prior Function            PT Goals (current goals can now be found in the care plan section)  Acute Rehab PT Goals Patient Stated Goal: to go home  PT Goal Formulation: With patient Time For Goal Achievement: 05/22/20 Potential to Achieve Goals: Good Progress towards PT goals: Progressing toward goals    Frequency    BID      PT Plan Current plan remains appropriate    Co-evaluation              AM-PAC PT "6 Clicks" Mobility   Outcome Measure  Help needed turning from your back to your side while in a flat bed without using bedrails?: A Little Help needed moving from lying on your back to sitting on the side of a flat bed without using bedrails?: A Little Help needed moving to and from a bed to a chair  (including a wheelchair)?: A Little Help needed standing up from a chair using your arms (e.g., wheelchair or bedside chair)?: A Little Help needed to walk in hospital room?: A Little Help needed climbing 3-5 steps with a railing? : A Little 6 Click Score: 18    End of Session Equipment Utilized During Treatment: Gait belt Activity Tolerance: Patient tolerated treatment well Patient left: with call bell/phone within reach;with chair alarm set;in bed Nurse Communication: Mobility status PT Visit Diagnosis: Muscle weakness (generalized) (M62.81);Difficulty in walking, not elsewhere classified (R26.2);Pain Pain - Right/Left: Left Pain - part of body: Hip     Time: 1339-1406 PT Time Calculation (min) (ACUTE ONLY): 27 min  Charges:  $Gait Training: 8-22 mins $Therapeutic Activity: 8-22 mins                     Chrystina Naff H. Owens Shark, PT, DPT, NCS 05/09/20, 11:16 PM [phone]

## 2020-05-10 LAB — CBC
HCT: 24.5 % — ABNORMAL LOW (ref 39.0–52.0)
Hemoglobin: 8.5 g/dL — ABNORMAL LOW (ref 13.0–17.0)
MCH: 32.3 pg (ref 26.0–34.0)
MCHC: 34.7 g/dL (ref 30.0–36.0)
MCV: 93.2 fL (ref 80.0–100.0)
Platelets: 141 10*3/uL — ABNORMAL LOW (ref 150–400)
RBC: 2.63 MIL/uL — ABNORMAL LOW (ref 4.22–5.81)
RDW: 13.2 % (ref 11.5–15.5)
WBC: 11.8 10*3/uL — ABNORMAL HIGH (ref 4.0–10.5)
nRBC: 0 % (ref 0.0–0.2)

## 2020-05-10 LAB — GLUCOSE, CAPILLARY
Glucose-Capillary: 121 mg/dL — ABNORMAL HIGH (ref 70–99)
Glucose-Capillary: 117 mg/dL — ABNORMAL HIGH (ref 70–99)
Glucose-Capillary: 148 mg/dL — ABNORMAL HIGH (ref 70–99)
Glucose-Capillary: 132 mg/dL — ABNORMAL HIGH (ref 70–99)
Glucose-Capillary: 128 mg/dL — ABNORMAL HIGH (ref 70–99)

## 2020-05-10 LAB — BASIC METABOLIC PANEL
Anion gap: 9 (ref 5–15)
BUN: 33 mg/dL — ABNORMAL HIGH (ref 8–23)
CO2: 25 mmol/L (ref 22–32)
Calcium: 7.6 mg/dL — ABNORMAL LOW (ref 8.9–10.3)
Chloride: 99 mmol/L (ref 98–111)
Creatinine, Ser: 1.08 mg/dL (ref 0.61–1.24)
GFR calc Af Amer: >60 mL/min (ref >60)
GFR calc non Af Amer: >60 mL/min (ref >60)
Glucose, Bld: 130 mg/dL — ABNORMAL HIGH (ref 70–99)
Potassium: 4 mmol/L (ref 3.5–5.1)
Sodium: 133 mmol/L — ABNORMAL LOW (ref 135–145)

## 2020-05-10 LAB — SURGICAL PATHOLOGY

## 2020-05-10 MED ORDER — HYDROCODONE-ACETAMINOPHEN 5-325 MG PO TABS: 1.0000 | ORAL_TABLET | Freq: Four times a day (QID) | ORAL | 0 refills | Status: DC | PRN

## 2020-05-10 MED ORDER — ENOXAPARIN SODIUM 40 MG/0.4ML ~~LOC~~ SOLN: 40.0000 mg | SUBCUTANEOUS | 0 refills | Status: DC

## 2020-05-10 MED ORDER — TRAMADOL HCL 50 MG PO TABS: 50.0000 mg | ORAL_TABLET | Freq: Four times a day (QID) | ORAL | 0 refills | Status: DC | PRN

## 2020-05-10 NOTE — Progress Notes (Addendum)
   Subjective: 2 Days Post-Op Procedure(s) (LRB): ARTHROPLASTY BIPOLAR HIP (HEMIARTHROPLASTY) (Left) Patient reports pain as mild.   Patient is well, and has had no acute complaints or problems Denies any CP, SOB, ABD pain. We will continue therapy today.  Plan is to go Home after hospital stay.  Objective: Vital signs in last 24 hours: Temp:  [97 F (36.1 C)-99.3 F (37.4 C)] 99.3 F (37.4 C) (07/29 0836) Pulse Rate:  [78-109] 97 (07/29 0836) Resp:  [17-18] 17 (07/29 0836) BP: (114-153)/(62-68) 127/68 (07/29 0836) SpO2:  [87 %-98 %] 91 % (07/29 0836)  Intake/Output from previous day: 07/28 0701 - 07/29 0700 In: 1863.5 [P.O.:120; I.V.:1743.5] Out: 700 [Urine:700] Intake/Output this shift: No intake/output data recorded.  Recent Labs    05/07/20 1800 05/08/20 0445 05/09/20 0456  HGB 14.9 13.3 10.1*   Recent Labs    05/08/20 0445 05/09/20 0456  WBC 10.5 14.8*  RBC 4.17* 3.15*  HCT 39.4 30.8*  PLT 148* 139*   Recent Labs    05/08/20 0445 05/09/20 0456  NA 136 134*  K 3.7 4.3  CL 100 100  CO2 25 27  BUN 22 28*  CREATININE 1.18 1.29*  GLUCOSE 120* 130*  CALCIUM 8.2* 7.4*   Recent Labs    05/07/20 1800  INR 1.0    EXAM General - Patient is Alert, Appropriate and Oriented Extremity - Neurovascular intact Sensation intact distally Intact pulses distally Dorsiflexion/Plantar flexion intact No cellulitis present Compartment soft Dressing - dressing C/D/I and no drainage, Praveena intact without drainage Motor Function - intact, moving foot and toes well on exam.   Past Medical History:  Diagnosis Date  . Hyperlipidemia   . Hypertension     Assessment/Plan:   2 Days Post-Op Procedure(s) (LRB): ARTHROPLASTY BIPOLAR HIP (HEMIARTHROPLASTY) (Left) Active Problems:   Benign essential HTN   Pure hypercholesterolemia   Controlled type 2 diabetes mellitus without complication (HCC)   Hyperlipidemia   Closed left hip fracture (HCC)   AKI (acute  kidney injury) ([place])  Estimated body mass index is 25.73 kg/m as calculated from the following:   Height as of this encounter: 6\' 1"  (1.854 m).   Weight as of this encounter: 88.5 kg. Advance diet Up with therapy  Acute postop blood loss anemia-hemoglobin 10.1, stable Pain well controlled Vital signs are stable Encourage incentive spirometer Work on bowel movement Care management to assist with discharge to home with HHPT    Lovenox 40 mg subcu daily x14 days at discharge TED hose bilateral lower extremity x6 weeks Remove Praveena negative pressure dressing on 05/17/2020 and apply honeycomb dressing Follow-up with [facility]opedics 2 weeks postop   DVT Prophylaxis - Lovenox, TED hose and SCDs Weight-Bearing as tolerated to left leg   T. Rachelle Hora, PA-C [place] 05/10/2020, 8:53 AM

## 2020-05-10 NOTE — Progress Notes (Signed)
Physical Therapy Treatment Patient Details Name: Lonnie Clarke MRN: [id number] DOB: November 05, 1940 Today's Date: 05/10/2020    History of Present Illness 79 y.o. male with a history of hyperlipidemia hypertension diabetes who comes the ED complaining of left hip pain after a mechanical fall while carrying boxes up steps and hitting door frame, falling backwards. 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 Patient is usually active and exercises at the gym frequently     PT Comments    Remains generally hesitant/resistant to L hip flexion with isolated activities; improves with cuing for symmetrical weight shift and facilitated reaching activities.  Able to complete stair negotiation with bilat rails, up/down x4, cga; patient pleasantly surprised by performance.  Voices comfort with task; wife present to observe.  Both report railings will be present on home stair upon discharge.    Follow Up Recommendations  Home health PT     Equipment Recommendations  Rolling walker with 5" wheels;3in1 (PT)    Recommendations for Other Services       Precautions / Restrictions Precautions Precautions: Fall;Anterior Hip Restrictions Weight Bearing Restrictions: Yes LLE Weight Bearing: Weight bearing as tolerated    Mobility  Bed Mobility Overal bed mobility: Needs Assistance Bed Mobility: Supine to Sit     Supine to sit: Min assist Sit to supine: Min assist   General bed mobility comments: assist for L LE management; difficulty with isolated movement in supine position  Transfers Overall transfer level: Needs assistance Equipment used: Rolling walker (2 wheeled) Transfers: Sit to/from Stand Sit to Stand: Min guard;Supervision         General transfer comment: good carry-over of technique; limited active use of L LE  Ambulation/Gait Ambulation/Gait assistance: Supervision Gait Distance (Feet):  (140' x2)         General Gait Details: brisk cadence, but improved  weight acceptance/stance time L LE.  Visible improvement in gait comfort/confidence   Stairs Stairs: Yes Stairs assistance: Min guard Stair Management: Two rails Number of Stairs: 4 General stair comments: step to gait pattern; fair/good L LE control/stability in periods of modified SLS.  Patient pleasantly surprised with performance on stairs.   Wheelchair Mobility    Modified Rankin (Stroke Patients Only)       Balance Overall balance assessment: Needs assistance Sitting-balance support: No upper extremity supported;Feet supported Sitting balance-Leahy Scale: Good Sitting balance - Comments: slightly offset to R to unweight L hip   Standing balance support: Bilateral upper extremity supported Standing balance-Leahy Scale: Fair Standing balance comment: patient relying heavily on rolling walker for support                             Cognition Arousal/Alertness: Awake/alert Behavior During Therapy: WFL for tasks assessed/performed Overall Cognitive Status: Within Functional Limits for tasks assessed                                        Exercises Other Exercises Other Exercises: Seated LE therex, 1x15, active ROM: LAQs (emphasis on end-range flex/ext), marching, dynamic reaching (to facilitate L hip flexion/weight shift)    General Comments        Pertinent Vitals/Pain Pain Assessment: Faces Faces Pain Scale: Hurts little more Pain Location: L hip Pain Descriptors / Indicators: Grimacing;Sore Pain Intervention(s): Limited activity within patient's tolerance;Repositioned    Home Living  Prior Function            PT Goals (current goals can now be found in the care plan section) Acute Rehab PT Goals Patient Stated Goal: to go home  PT Goal Formulation: With patient Time For Goal Achievement: 05/22/20 Potential to Achieve Goals: Good Progress towards PT goals: Progressing toward goals     Frequency    BID      PT Plan Current plan remains appropriate    Co-evaluation              AM-PAC PT "6 Clicks" Mobility   Outcome Measure  Help needed turning from your back to your side while in a flat bed without using bedrails?: A Little Help needed moving from lying on your back to sitting on the side of a flat bed without using bedrails?: A Little Help needed moving to and from a bed to a chair (including a wheelchair)?: A Little Help needed standing up from a chair using your arms (e.g., wheelchair or bedside chair)?: A Little Help needed to walk in hospital room?: A Little Help needed climbing 3-5 steps with a railing? : A Little 6 Click Score: 18    End of Session Equipment Utilized During Treatment: Gait belt Activity Tolerance: Patient tolerated treatment well Patient left: with call bell/phone within reach;with chair alarm set;in bed Nurse Communication: Mobility status PT Visit Diagnosis: Muscle weakness (generalized) (M62.81);Difficulty in walking, not elsewhere classified (R26.2);Pain Pain - Right/Left: Left Pain - part of body: Hip     Time: 1431-1510 PT Time Calculation (min) (ACUTE ONLY): 39 min  Charges:  $Gait Training: 8-22 mins $Therapeutic Exercise: 8-22 mins $Therapeutic Activity: 8-22 mins                     Mckay Brandt H. Owens Shark, PT, DPT, NCS 05/10/20, 9:01 PM [place] 05/10/2020, 9:00 PM

## 2020-05-10 NOTE — Care Management Important Message (Signed)
Important Message  Patient Details  Name: Lonnie Clarke MRN: [id number] Date of Birth: April 15, 1941   Medicare Important Message Given:  Yes     [name] A Mariacristina Aday 05/10/2020, 12:35 PM

## 2020-05-10 NOTE — Progress Notes (Signed)
Physical Therapy Treatment Patient Details Name: Lonnie Clarke MRN: [id number] DOB: 1941-06-30 Today's Date: 05/10/2020    History of Present Illness 79 y.o. male with a history of hyperlipidemia hypertension diabetes who comes the ED complaining of left hip pain after a mechanical fall while carrying boxes up steps and hitting door frame, falling backwards. 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 Patient is usually active and exercises at the gym frequently     PT Comments    Marked increase in gait distance this AM with mild improvement in L hip/knee flexion throughout gait cycle.  Mild desat to 86-87% with gait efforts; HR increase to 135 max.  Quickly recovers to baseline (>92%, HR 100s) with 30 sec seated rest. RN informed/aware.    Follow Up Recommendations  Home health PT     Equipment Recommendations  Rolling walker with 5" wheels;3in1 (PT)    Recommendations for Other Services       Precautions / Restrictions Precautions Precautions: Fall;Anterior Hip Restrictions Weight Bearing Restrictions: Yes LLE Weight Bearing: Weight bearing as tolerated    Mobility  Bed Mobility Overal bed mobility: Needs Assistance Bed Mobility: Supine to Sit     Supine to sit: Min assist;Mod assist     General bed mobility comments: for L LE elevation over edge of bed; performed towards L to simulate home environment.  Quick to insist on assist for truncal elevation prior to attempting task indep  Transfers Overall transfer level: Needs assistance Equipment used: Rolling walker (2 wheeled) Transfers: Sit to/from Stand Sit to Stand: Min guard;Supervision         General transfer comment: cuing for mechanics to maximize safety/indep; continues to rely heavily on UE support to complete  Ambulation/Gait Ambulation/Gait assistance: Min assist Gait Distance (Feet):  (75' x1, 125' x1)         General Gait Details: progressing towards step through gait pattern  with increased L hip/knee flexion noted. Improving cadence (somewhat brisk at times); min cuing for decreased force of R LE contact.   Stairs             Wheelchair Mobility    Modified Rankin (Stroke Patients Only)       Balance Overall balance assessment: Needs assistance Sitting-balance support: No upper extremity supported;Feet supported Sitting balance-Leahy Scale: Good Sitting balance - Comments: slightly offset to R to unweight L hip   Standing balance support: Bilateral upper extremity supported Standing balance-Leahy Scale: Fair Standing balance comment: patient relying heavily on rolling walker for support                             Cognition Arousal/Alertness: Awake/alert Behavior During Therapy: WFL for tasks assessed/performed Overall Cognitive Status: Within Functional Limits for tasks assessed                                        Exercises Other Exercises Other Exercises: Upper body dressing, set up/sup; lower body dressing, min assist to thread undergarments, min assist for standing balance to pull pants over hips.    General Comments        Pertinent Vitals/Pain Pain Assessment: Faces Faces Pain Scale: Hurts little more Pain Location: L hip Pain Descriptors / Indicators: Grimacing;Sore Pain Intervention(s): Limited activity within patient's tolerance;Monitored during session;Repositioned    Home Living  Prior Function            PT Goals (current goals can now be found in the care plan section) Acute Rehab PT Goals Patient Stated Goal: to go home  PT Goal Formulation: With patient Time For Goal Achievement: 05/22/20 Potential to Achieve Goals: Good Progress towards PT goals: Progressing toward goals    Frequency    BID      PT Plan Current plan remains appropriate    Co-evaluation              AM-PAC PT "6 Clicks" Mobility   Outcome Measure  Help needed  turning from your back to your side while in a flat bed without using bedrails?: A Little Help needed moving from lying on your back to sitting on the side of a flat bed without using bedrails?: A Little Help needed moving to and from a bed to a chair (including a wheelchair)?: A Little Help needed standing up from a chair using your arms (e.g., wheelchair or bedside chair)?: A Little Help needed to walk in hospital room?: A Little Help needed climbing 3-5 steps with a railing? : A Little 6 Click Score: 18    End of Session Equipment Utilized During Treatment: Gait belt Activity Tolerance: Patient tolerated treatment well Patient left: with call bell/phone within reach;with chair alarm set;in bed Nurse Communication: Mobility status PT Visit Diagnosis: Muscle weakness (generalized) (M62.81);Difficulty in walking, not elsewhere classified (R26.2);Pain Pain - Right/Left: Left Pain - part of body: Hip     Time: 5056-9794 PT Time Calculation (min) (ACUTE ONLY): 34 min  Charges:  $Gait Training: 8-22 mins $Therapeutic Activity: 8-22 mins                     Latayvia Mandujano H. Owens Shark, PT, DPT, NCS 05/10/20, 8:51 PM [phone]

## 2020-05-10 NOTE — Progress Notes (Signed)
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  PT recs home health. Home health orders placed. Therapy will work with pt today possibly walk up steps as pt has steps in his house.   Abnormal EKG: echo shows EF 60-65%, indeterminate diastolic function, no wall motion abnormalities.   HTN: continue to hold ACE-I secondary to AKI. Hydralazine prn   Hyperlipidemia: continue on statin  PAD: continue on statin, aspirin   AKI:  will continue to hold ACE-I. Cr is trending down from day prior. Will continue to monitor   DM2: well controlled w/ HbA1c 5.5. Continue on SSI w/ accuchecks   Thrombocytopenia: etiology unknown. Labile. Will continue to monitor   Leukocytosis: likely secondary to recent surgery.  Trending down today. Will continue to monitor   DVT prophylaxis: lovenox Code Status: full  Family Communication:  Disposition Plan: likely d/c home w/ home health    Consultants:   Ortho surg    Procedures:   Antimicrobials:  Subjective: Pt c/o hip pain w/ ambulating   Objective: Vitals:   05/09/20 2309 05/09/20 2311 05/10/20 0407 05/10/20 0836  BP: (!) 153/64  (!) 135/66 127/68  Pulse: (!) 109 94 95 97  Resp: 18  18 17   Temp: (!) 97 F (36.1 C)  98.8 F (37.1 C) 99.3 F (37.4 C)  TempSrc: Oral  Oral Oral  SpO2: (!) 87% 98% 96% 91%  Weight:      Height:        Intake/Output Summary (Last 24 hours) at 05/10/2020 0853 Last data filed at 05/10/2020 0513 Gross per 24 hour  Intake 1863.45 ml  Output 700 ml  Net 1163.45 ml   Filed Weights   05/07/20 1520 05/08/20 0026    Weight: 88.5 kg 88.5 kg    Examination:  General exam: Appears calm and comfortable  Respiratory system: Clear to auscultation. Normal RR. No wheezes Cardiovascular system: S1 & S2 +. No rubs, gallops or clicks Gastrointestinal system: Abdomen is distended, soft and nontender. Hypoactive bowel sounds heard. Central nervous system: Alert and oriented. Moves all for 4 extremities Psychiatry: Judgement and insight appear normal. Mood & affect appropriate.     Data Reviewed: I have personally reviewed following labs and imaging studies  CBC: Recent Labs  Lab 05/07/20 1800 05/08/20 0445 05/09/20 0456  WBC 11.9* 10.5 14.8*  NEUTROABS 10.7*  --   --   HGB 14.9 13.3 10.1*  HCT 44.5 39.4 30.8*  MCV 95.5 94.5 97.8  PLT 147* 148* 177*   Basic Metabolic Panel: Recent Labs  Lab 05/07/20 1800 05/08/20 0445 05/09/20 0456  NA 135 136 134*  K 3.7 3.7 4.3  CL 100 100 100  CO2 25 25 27   GLUCOSE 158* 120* 130*  BUN 20 22 28*  CREATININE 1.35* 1.18 1.29*  CALCIUM 8.4* 8.2* 7.4*   GFR: Estimated Creatinine Clearance: 54.2 mL/min (A) (by C-G formula based on SCr of 1.29 mg/dL (H)). Liver Function Tests: Recent Labs  Lab 05/07/20 1806  AST 28  ALT 25  ALKPHOS 54  BILITOT 1.0  PROT 7.9  ALBUMIN 4.1   No results for input(s): LIPASE, AMYLASE in the last 168 hours. No results for input(s): AMMONIA in the last 168 hours. Coagulation Profile: Recent Labs  Lab 05/07/20 1800  INR 1.0   Cardiac Enzymes: No results for input(s): CKTOTAL, CKMB, CKMBINDEX, TROPONINI in the last 168 hours. BNP (last 3 results) No results for input(s): PROBNP in the last 8760 hours. HbA1C: Recent Labs    05/07/20 1806  HGBA1C 5.5   CBG: Recent Labs  Lab 05/09/20 1606 05/09/20 2024 05/09/20 2307 05/10/20 0359 05/10/20 0838  GLUCAP 149* 159* 111* 121* 117*   Lipid Profile: No results for input(s): CHOL, HDL, LDLCALC, TRIG, CHOLHDL, LDLDIRECT in the last 72 hours. Thyroid Function  Tests: No results for input(s): TSH, T4TOTAL, FREET4, T3FREE, THYROIDAB in the last 72 hours. Anemia Panel: No results for input(s): VITAMINB12, FOLATE, FERRITIN, TIBC, IRON, RETICCTPCT in the last 72 hours. Sepsis Labs: No results for input(s): PROCALCITON, LATICACIDVEN in the last 168 hours.  Recent Results (from the past 240 hour(s))  SARS Coronavirus 2 by RT PCR (hospital order, performed in [facility] hospital lab) Nasopharyngeal Nasopharyngeal Swab     Status: None   Collection Time: 05/07/20  6:06 PM   Specimen: Nasopharyngeal Swab  Result Value Ref Range Status   SARS Coronavirus 2 NEGATIVE NEGATIVE Final    Comment: (NOTE) SARS-CoV-2 target nucleic acids are NOT DETECTED.  The SARS-CoV-2 RNA is generally detectable in upper and lower respiratory specimens during the acute phase of infection. The lowest concentration of SARS-CoV-2 viral copies this assay can detect is 250 copies / mL. A negative result does not preclude SARS-CoV-2 infection and should not be used as the sole basis for treatment or other patient management decisions.  A negative result may occur with improper specimen collection / handling, submission of specimen other than nasopharyngeal swab, presence of viral mutation(s) within the areas targeted by this assay, and inadequate number of viral copies (<250 copies / mL). A negative result must be combined with clinical observations, patient history, and epidemiological information.  Fact Sheet for Patients:   StrictlyIdeas.no  Fact Sheet for Healthcare Providers: BankingDealers.co.za  This test is not yet approved or  cleared by the [place] FDA and has been authorized for detection and/or diagnosis of SARS-CoV-2 by FDA under an Emergency Use Authorization (EUA).  This EUA will remain in effect (meaning this test can be used) for the duration of the COVID-19 declaration under Section 564(b)(1) of the  Act, 21 U.S.C. section 360bbb-3(b)(1), unless the authorization is terminated or revoked sooner.  Performed at [facility], [address] [place], [place] [address]   MRSA PCR Screening     Status: None   Collection Time: 05/08/20  3:18 AM   Specimen: Nasal Mucosa; Nasopharyngeal  Result Value Ref Range Status   MRSA by PCR NEGATIVE NEGATIVE Final    Comment:        The GeneXpert MRSA Assay (FDA approved for NASAL specimens only), is one component of a comprehensive MRSA colonization surveillance program. It is not intended to diagnose MRSA infection nor to guide or monitor treatment for MRSA infections. Performed at [facility], [address] [place], [place] [address]          Radiology Studies: ECHOCARDIOGRAM COMPLETE  Result Date: 05/09/2020    ECHOCARDIOGRAM REPORT   Patient Name:   CHERYL STABENOW Date of Exam: 05/08/2020 Medical Rec #:  [id number]      Height:  73.0 in Accession #:    [id number]     Weight:       195.0 lb Date of Birth:  Nov 08, 1940      BSA:          2.129 m Patient Age:    22 years       BP:           131/70 mmHg Patient Gender: M              HR:           77 bpm. Exam Location:  [facility] Procedure: 2D Echo, Cardiac Doppler and Color Doppler Indications:     Abnormal ECG 794.31  History:         Patient has no prior history of Echocardiogram examinations.                  Risk Factors:Hypertension and Dyslipidemia.  Sonographer:     Sherrie Sport RDCS (AE) Referring Phys:  [facility] Diagnosing Phys: Ida Rogue MD  Sonographer Comments: no apical window and no subcostal window. IMPRESSIONS  1. Left ventricular ejection fraction, by estimation, is 60 to 65%. The left ventricle has normal function. The left ventricle has no regional wall motion abnormalities. Left ventricular diastolic parameters are indeterminate.  2. Right ventricular systolic function is normal. The right ventricular size is normal.  3. Challenging images  FINDINGS  Left Ventricle: Left ventricular ejection fraction, by estimation, is 60 to 65%. The left ventricle has normal function. The left ventricle has no regional wall motion abnormalities. The left ventricular internal cavity size was normal in size. There is  no left ventricular hypertrophy. Left ventricular diastolic parameters are indeterminate. Right Ventricle: The right ventricular size is normal. No increase in right ventricular wall thickness. Right ventricular systolic function is normal. Left Atrium: Left atrial size was normal in size. Right Atrium: Right atrial size was normal in size. Pericardium: There is no evidence of pericardial effusion. Mitral Valve: The mitral valve is normal in structure. Normal mobility of the mitral valve leaflets. No evidence of mitral valve regurgitation. No evidence of mitral valve stenosis. There is no evidence of mitral valve vegetation. Tricuspid Valve: The tricuspid valve is normal in structure. Tricuspid valve regurgitation is not demonstrated. No evidence of tricuspid stenosis. There is no evidence of tricuspid valve vegetation. Aortic Valve: The aortic valve is normal in structure. Aortic valve regurgitation is not visualized. Mild aortic valve sclerosis is present, with no evidence of aortic valve stenosis. There is no evidence of aortic valve vegetation. Pulmonic Valve: The pulmonic valve was normal in structure. Pulmonic valve regurgitation is not visualized. No evidence of pulmonic stenosis. There is no evidence of pulmonic valve vegetation. Aorta: The aortic root is normal in size and structure. Venous: The pulmonary veins were not well visualized. The inferior vena cava is normal in size with greater than 50% respiratory variability, suggesting right atrial pressure of 3 mmHg. IAS/Shunts: No atrial level shunt detected by color flow Doppler.  LEFT VENTRICLE PLAX 2D LVIDd:         4.72 cm LVIDs:         2.88 cm LV PW:         1.17 cm LV IVS:        1.27 cm  LVOT diam:     2.10 cm LVOT Area:     3.46 cm  LEFT ATRIUM         Index LA diam:  3.30 cm 1.55 cm/m   AORTA Ao Root diam: 3.50 cm  SHUNTS Systemic Diam: 2.10 cm Ida Rogue MD Electronically signed by Ida Rogue MD Signature Date/Time: 05/09/2020/6:35:55 AM    Final    DG HIP OPERATIVE UNILAT W OR W/O PELVIS LEFT  Result Date: 05/08/2020 CLINICAL DATA:  Total left hip replacement. EXAM: OPERATIVE LEFT HIP (WITH PELVIS IF PERFORMED) 3 VIEWS TECHNIQUE: Fluoroscopic spot image(s) were submitted for interpretation post-operatively. COMPARISON:  05/07/2020. FINDINGS: Total left hip replacement. Hardware intact. Anatomic alignment. Fluoroscopy time 0 minutes 30 seconds. Radiation dose 9.8 mGy. IMPRESSION: Total left hip replacement.  Hardware intact.  Anatomic alignment. Electronically Signed   By: Marcello Moores  Register   On: 05/08/2020 11:39   DG HIP UNILAT W OR W/O PELVIS 2-3 VIEWS LEFT  Result Date: 05/08/2020 CLINICAL DATA:  Status post left hip replacement. EXAM: DG HIP (WITH OR WITHOUT PELVIS) 2-3V LEFT COMPARISON:  05/07/2020 FINDINGS: Sequelae of interval left total hip arthroplasty are identified. No dislocation or acute fracture is identified. Postoperative gas is noted in the surrounding soft tissues. Skin staples and a wound VAC are noted. Sequelae of previous left inguinal hernia repair are also again noted. IMPRESSION: Interval left total hip arthroplasty without evidence of acute osseous abnormality. Electronically Signed   By: Logan Bores M.D.   On: 05/08/2020 12:40        Scheduled Meds: . Chlorhexidine Gluconate Cloth  6 each Topical Daily  . docusate sodium  100 mg Oral BID  . enoxaparin (LOVENOX) injection  40 mg Subcutaneous Q24H  . insulin aspart  0-9 Units Subcutaneous Q4H  . pravastatin  20 mg Oral q1800  . traMADol  50 mg Oral Q6H   Continuous Infusions: . sodium chloride Stopped (05/09/20 1029)  . methocarbamol (ROBAXIN) IV       LOS: 3 days    Time spent: 33  mins     Wyvonnia Dusky, MD [facility] Pager [phone]  If 7PM-7AM, please contact night-coverage [email or web address] 05/10/2020, 8:53 AM

## 2020-05-11 DIAGNOSIS — S72009A Fracture of unspecified part of neck of unspecified femur, initial encounter for closed fracture: Secondary | ICD-10-CM

## 2020-05-11 LAB — BASIC METABOLIC PANEL
Anion gap: 7 (ref 5–15)
BUN: 30 mg/dL — ABNORMAL HIGH (ref 8–23)
CO2: 27 mmol/L (ref 22–32)
Calcium: 7.6 mg/dL — ABNORMAL LOW (ref 8.9–10.3)
Chloride: 101 mmol/L (ref 98–111)
Creatinine, Ser: 1.09 mg/dL (ref 0.61–1.24)
GFR calc Af Amer: >60 mL/min (ref >60)
GFR calc non Af Amer: >60 mL/min (ref >60)
Glucose, Bld: 119 mg/dL — ABNORMAL HIGH (ref 70–99)
Potassium: 4 mmol/L (ref 3.5–5.1)
Sodium: 135 mmol/L (ref 135–145)

## 2020-05-11 LAB — GLUCOSE, CAPILLARY
Glucose-Capillary: 108 mg/dL — ABNORMAL HIGH (ref 70–99)
Glucose-Capillary: 118 mg/dL — ABNORMAL HIGH (ref 70–99)
Glucose-Capillary: 119 mg/dL — ABNORMAL HIGH (ref 70–99)
Glucose-Capillary: 98 mg/dL (ref 70–99)

## 2020-05-11 LAB — CBC
HCT: 23.6 % — ABNORMAL LOW (ref 39.0–52.0)
Hemoglobin: 8.1 g/dL — ABNORMAL LOW (ref 13.0–17.0)
MCH: 32.1 pg (ref 26.0–34.0)
MCHC: 34.3 g/dL (ref 30.0–36.0)
MCV: 93.7 fL (ref 80.0–100.0)
Platelets: 146 10*3/uL — ABNORMAL LOW (ref 150–400)
RBC: 2.52 MIL/uL — ABNORMAL LOW (ref 4.22–5.81)
RDW: 13.2 % (ref 11.5–15.5)
WBC: 7.6 10*3/uL (ref 4.0–10.5)
nRBC: 0 % (ref 0.0–0.2)

## 2020-05-11 NOTE — TOC Progression Note (Signed)
Transition of Care [facility]) - Progression Note    Patient Details  Name: DEWARREN LEDBETTER MRN: [id number] Date of Birth: 1941-06-14  Transition of Care [facility]) CM/SW [place], RN Phone Number: 05/11/2020, 3:16 PM  Clinical Narrative:   I received notification that [facility] cant see the patient, I notified Dr Marry Guan on his personal Cell; phone that the patient does not have [place] set up and asked if he could go to Outpatient PT,     Expected Discharge Plan: [place] Barriers to Discharge: Continued Medical Work up  Expected Discharge Plan and Services Expected Discharge Plan: [place]   Discharge Planning Services: CM Consult   Living arrangements for the past 2 months: Single Family Home Expected Discharge Date: 05/11/20               DME Arranged: [place] Rile rolling, 3-N-1 DME Agency: [employer] Date DME Agency Contacted: 05/09/20 Time DME Agency Contacted: [phone] Representative spoke with at DME Agency: zack HH Arranged: PT [place]: [facility]formerly [employer]) Date [place]: 05/09/20 Time [place]: [place] Representative spoke with at [place]: [place] ([place]) Interventions    Readmission Risk Interventions No flowsheet data found.

## 2020-05-11 NOTE — Discharge Instructions (Signed)
Acute Pain, Adult Acute pain is a type of sudden pain that may last for just a few days or for as long as six months. It is often related to an illness, injury, or medical procedure. Acute pain may be mild, moderate, or severe. Pain can make it hard for you to do your normal, daily activities. It can cause anxiety and lead to other problems if it is left untreated. Treatment depends on the cause and severity of your pain. Acute pain usually goes away once your injury has healed or you are no longer ill. Follow these instructions at home: Medicines   Take over-the-counter and prescription medicines only as told by your health care provider.  Take the lowest dose of medicine for the shortest amount of time needed to relieve the pain.  If you are taking prescription pain medicine: ? Do not stop taking the medicine suddenly. Talk to your health care provider about how and when to discontinue prescription medicine. ? Do not take more pills than told by your health care provider even if your pain is severe. ? Do not take other over-the-counter pain medicines in addition to prescription pain medicine unless told by your health care provider. ? Ask your health care provider if the medicine requires you to avoid driving or using heavy machinery. ? Ask your health care provider if the medicine can cause constipation. You may need to take these actions to prevent or treat constipation:  Drink enough fluid to keep your urine pale yellow.  Eat foods that are high in fiber, such as beans, whole grains, and fresh fruits and vegetables.  Take over-the-counter or prescription medicines.  Limit foods that are high in fat and processed sugars, such as fried or sweet foods. Managing pain, stiffness, and swelling     If directed, put ice on the affected area. To do this:  Put ice in a plastic bag.  Place a towel between your skin and the bag.  Leave the ice on for 20 minutes, 2-3 times a day. If  directed, apply heat to the affected area as often as told by your health care provider. Use the heat source that your health care provider recommends, such as a moist heat pack or a heating pad.  Place a towel between your skin and the heat source.  Leave the heat on for 20-30 minutes.  Remove the heat if your skin turns bright red. This is especially important if you are unable to feel pain, heat, or cold. You may have a greater risk of getting burned.  Activity  Rest as told by your health care provider.  Return to your normal activities as told by your health care provider. Ask your health care provider what activities are safe for you. General instructions  Check your pain level as told by your health care provider.  Ask your health care provider if other strategies such as distraction, relaxation, or physical therapies can help your pain.  Keep all follow-up visits as told by your health care provider. This is important. Contact a health care provider if:  Your pain is not controlled by medicine.  Your pain does not improve or gets worse.  You have side effects from pain medicines, such as vomiting or confusion. Get help right away if you:  Have severe pain.  Have trouble breathing.  Lose consciousness.  Have chest pain or pressure that lasts for more than a few minutes, or if you have other symptoms along with chest  pain, including if you: ? Have pain or discomfort in one or both arms, your back, neck, jaw, or stomach. ? Have shortness of breath. ? Break out in a cold sweat. ? Feel nauseous. ? Become light-headed. These symptoms may represent a serious problem that is an emergency. Do not wait to see if the symptoms will go away. Get medical help right away. Call your local emergency services (911 in the [place] Do not drive yourself to the hospital. Summary  Acute pain may be mild, moderate, or severe. It usually goes away once your injury has healed or you are no  longer ill.  Take over-the-counter and prescription medicines only as told by your health care provider.  Ask your health care provider if the medicine prescribed to you can cause constipation.  Contact a health care provider if your pain is not controlled by medicine. This information is not intended to replace advice given to you by your health care provider. Make sure you discuss any questions you have with your health care provider. Document Revised: 02/14/2019 Document Reviewed: 02/14/2019 Elsevier Patient Education  [place].   Diabetes Basics  Diabetes (diabetes mellitus) is a long-term (chronic) disease. It occurs when the body does not properly use sugar (glucose) that is released from food after you eat. Diabetes may be caused by one or both of these problems:  Your pancreas does not make enough of a hormone called insulin.  Your body does not react in a normal way to insulin that it makes. Insulin lets sugars (glucose) go into cells in your body. This gives you energy. If you have diabetes, sugars cannot get into cells. This causes high blood sugar (hyperglycemia). Follow these instructions at home: How is diabetes treated? You may need to take insulin or other diabetes medicines daily to keep your blood sugar in balance. Take your diabetes medicines every day as told by your doctor. List your diabetes medicines here: Diabetes medicines  Name of medicine: ______________________________ ? Amount (dose): _______________ Time (a.m./p.m.): _______________ Notes: ___________________________________  Name of medicine: ______________________________ ? Amount (dose): _______________ Time (a.m./p.m.): _______________ Notes: ___________________________________  Name of medicine: ______________________________ ? Amount (dose): _______________ Time (a.m./p.m.): _______________ Notes: ___________________________________ If you use insulin, you will learn how to give yourself  insulin by injection. You may need to adjust the amount based on the food that you eat. List the types of insulin you use here: Insulin  Insulin type: ______________________________ ? Amount (dose): _______________ Time (a.m./p.m.): _______________ Notes: ___________________________________  Insulin type: ______________________________ ? Amount (dose): _______________ Time (a.m./p.m.): _______________ Notes: ___________________________________  Insulin type: ______________________________ ? Amount (dose): _______________ Time (a.m./p.m.): _______________ Notes: ___________________________________  Insulin type: ______________________________ ? Amount (dose): _______________ Time (a.m./p.m.): _______________ Notes: ___________________________________  Insulin type: ______________________________ ? Amount (dose): _______________ Time (a.m./p.m.): _______________ Notes: ___________________________________ How do I manage my blood sugar?  Check your blood sugar levels using a blood glucose monitor as directed by your doctor. Your doctor will set treatment goals for you. Generally, you should have these blood sugar levels:  Before meals (preprandial): 80-130 mg/dL (4.4-7.2 mmol/L).  After meals (postprandial): below 180 mg/dL (10 mmol/L).  A1c level: less than 7%. Write down the times that you will check your blood sugar levels: Blood sugar checks  Time: _______________ Notes: ___________________________________  Time: _______________ Notes: ___________________________________  Time: _______________ Notes: ___________________________________  Time: _______________ Notes: ___________________________________  Time: _______________ Notes: ___________________________________  Time: _______________ Notes: ___________________________________  What do I need to know about low blood sugar? Low  blood sugar is called hypoglycemia. This is when blood sugar is at or below 70 mg/dL (3.9  mmol/L). Symptoms may include:  Feeling: ? Hungry. ? Worried or nervous (anxious). ? Sweaty and clammy. ? Confused. ? Dizzy. ? Sleepy. ? Sick to your stomach (nauseous).  Having: ? A fast heartbeat. ? A headache. ? A change in your vision. ? Tingling or no feeling (numbness) around the mouth, lips, or tongue. ? Jerky movements that you cannot control (seizure).  Having trouble with: ? Moving (coordination). ? Sleeping. ? Passing out (fainting). ? Getting upset easily (irritability). Treating low blood sugar To treat low blood sugar, eat or drink something sugary right away. If you can think clearly and swallow safely, follow the 15:15 rule:  Take 15 grams of a fast-acting carb (carbohydrate). Talk with your doctor about how much you should take.  Some fast-acting carbs are: ? Sugar tablets (glucose pills). Take 3-4 glucose pills. ? 6-8 pieces of hard candy. ? 4-6 oz (120-150 mL) of fruit juice. ? 4-6 oz (120-150 mL) of regular (not diet) soda. ? 1 Tbsp (15 mL) honey or sugar.  Check your blood sugar 15 minutes after you take the carb.  If your blood sugar is still at or below 70 mg/dL (3.9 mmol/L), take 15 grams of a carb again.  If your blood sugar does not go above 70 mg/dL (3.9 mmol/L) after 3 tries, get help right away.  After your blood sugar goes back to normal, eat a meal or a snack within 1 hour. Treating very low blood sugar If your blood sugar is at or below 54 mg/dL (3 mmol/L), you have very low blood sugar (severe hypoglycemia). This is an emergency. Do not wait to see if the symptoms will go away. Get medical help right away. Call your local emergency services (911 in the [place] Do not drive yourself to the hospital. Questions to ask your health care provider  Do I need to meet with a diabetes educator?  What equipment will I need to care for myself at home?  What diabetes medicines do I need? When should I take them?  How often do I need to check my  blood sugar?  What number can I call if I have questions?  When is my next doctor's visit?  Where can I find a support group for people with diabetes? Where to find more information  American Diabetes Association: www.diabetes.org  American Association of Diabetes Educators: www.diabeteseducator.org/patient-resources Contact a doctor if:  Your blood sugar is at or above 240 mg/dL (13.3 mmol/L) for 2 days in a row.  You have been sick or have had a fever for 2 days or more, and you are not getting better.  You have any of these problems for more than 6 hours: ? You cannot eat or drink. ? You feel sick to your stomach (nauseous). ? You throw up (vomit). ? You have watery poop (diarrhea). Get help right away if:  Your blood sugar is lower than 54 mg/dL (3 mmol/L).  You get confused.  You have trouble: ? Thinking clearly. ? Breathing. Summary  Diabetes (diabetes mellitus) is a long-term (chronic) disease. It occurs when the body does not properly use sugar (glucose) that is released from food after digestion.  Take insulin and diabetes medicines as told.  Check your blood sugar every day, as often as told.  Keep all follow-up visits as told by your doctor. This is important. This information is not intended to replace  advice given to you by your health care provider. Make sure you discuss any questions you have with your health care provider. Document Revised: 06/22/2019 Document Reviewed: 01/01/2018 Elsevier Patient Education  2020 [place].   Continuous Peripheral Nerve Block Infusion Self-Care A continuous peripheral nerve block infusion provides pain relief after certain kinds of surgery, such as shoulder or knee surgery. A pump delivers numbing medicine near a nerve, which lessens the amount of pain you feel and allows you to be leave the hospital sooner. How to protect the numb area Until feeling returns to the numbed area of your body, take these actions to  protect the numb area.  Carefully pad the numb area. This will help prevent injury and the development of sores.  Be careful when using items that are hot or cold. You will not be able to feel temperature in the numb area, so your skin in that area can get damaged more easily.  If your arm or leg is numb, wear a sling or brace as told by your health care provider. If you were given crutches, use them as told.  Try to keep the numb area raised (elevated) whenever possible. This will help lessen the amount of swelling and make you more comfortable. How to care for the catheter Take these actions to care for your catheter for as long as it is in place:  While bathing, make sure to keep the catheter site, pump, and tubing dry.  Try to avoid kinking or pinching of the catheter.  Avoid pulling or tugging on the catheter. Follow your health care provider's instructions about when and how to remove the catheter. For most devices, you will need to take the following steps: 1. Wash your hands with soap and warm water. 2. Remove the bandage (dressing) along with any tape or additional bandages that are used to secure the catheter. 3. Firmly grasp the catheter where it enters the skin. 4. Gently pull the catheter. The catheter should come out easily. If it does not, or if you have pain while you pull it, stop and call your health care provider. Do not cut the catheter. 5. Look at the catheter to make sure the entire catheter was removed. Most catheters have a mark on the end. If you do not see a mark, or if the tubing is broken, contact your health care provider right away. 6. Cover the opening with an adhesive dressing. 7. Throw away the pump and tubing.  When you remove the catheter, it is normal to see a small amount of fluid in the place where the catheter was. Get help right away if:  The place where the catheter was inserted becomes red, painful, or swollen.  There is yellowish fluid coming  from the place where the catheter was.  You have a rash or hives.  You have uncontrolled pain in the area that should be numbed.  You have a fever.  You have symptoms of a reaction to the nerve block. Symptoms of a reaction include: ? Numb lips. ? A metallic taste in your mouth. ? Ringing in your ears. ? Increased anxiety. ? Sudden unexpected sleepiness. ? Blurred vision. ? Dizziness. ? Tremors, shaking, twitching, or seizures. ? Changes in bowel or bladder control. ? Trouble breathing. If you have any of these symptoms, clamp the catheter to stop the medicine from going into your body and get medical care right away. This information is not intended to replace advice given to you by  your health care provider. Make sure you discuss any questions you have with your health care provider. Document Revised: 09/11/2017 Document Reviewed: 01/25/2016 Elsevier Patient Education  [place] REPLACEMENT POSTOPERATIVE DIRECTIONS   Hip Rehabilitation, Guidelines Following Surgery  The results of a hip operation are greatly improved after range of motion and muscle strengthening exercises. Follow all safety measures which are given to protect your hip. If any of these exercises cause increased pain or swelling in your joint, decrease the amount until you are comfortable again. Then slowly increase the exercises. Call your caregiver if you have problems or questions.   HOME CARE INSTRUCTIONS  Remove items at home which could result in a fall. This includes throw rugs or furniture in walking pathways.   ICE to the affected hip every three hours for 30 minutes at a time and then as needed for pain and swelling.  Continue to use ice on the hip for pain and swelling from surgery. You may notice swelling that will progress down to the foot and ankle.  This is normal after surgery.  Elevate the leg when you are not up walking on it.    Continue to use the  breathing machine which will help keep your temperature down.  It is common for your temperature to cycle up and down following surgery, especially at night when you are not up moving around and exerting yourself.  The breathing machine keeps your lungs expanded and your temperature down.  Do not place pillow under knee, focus on keeping the knee straight while resting  DIET You may resume your previous home diet once your are discharged from the hospital.  DRESSING / WOUND CARE / SHOWERING Please remove provena negative pressure dressing on 05/17/2020 and apply honey comb dressing. Keep dressing clean and dry at all times.  ACTIVITY Walk with your walker as instructed. Use walker as long as suggested by your caregivers. Avoid periods of inactivity such as sitting longer than an hour when not asleep. This helps prevent blood clots.  You may resume a sexual relationship in one month or when given the OK by your doctor.  You may return to work once you are cleared by your doctor.  Do not drive a car for 6 weeks or until released by you surgeon.  Do not drive while taking narcotics.  WEIGHT BEARING Weight bearing as tolerated. Use walker/cane as needed for at least 4 weeks post op.  POSTOPERATIVE CONSTIPATION PROTOCOL Constipation - defined medically as fewer than three stools per week and severe constipation as less than one stool per week.  One of the most common issues patients have following surgery is constipation.  Even if you have a regular bowel pattern at home, your normal regimen is likely to be disrupted due to multiple reasons following surgery.  Combination of anesthesia, postoperative narcotics, change in appetite and fluid intake all can affect your bowels.  In order to avoid complications following surgery, here are some recommendations in order to help you during your recovery period.  Colace (docusate) - Pick up an over-the-counter form of Colace or another stool softener and  take twice a day as long as you are requiring postoperative pain medications.  Take with a full glass of water daily.  If you experience loose stools or diarrhea, hold the colace until you stool forms back up.  If your symptoms do not get better within 1 week or if they get worse, check with  your doctor.  Dulcolax (bisacodyl) - Pick up over-the-counter and take as directed by the product packaging as needed to assist with the movement of your bowels.  Take with a full glass of water.  Use this product as needed if not relieved by Colace only.   MiraLax (polyethylene glycol) - Pick up over-the-counter to have on hand.  MiraLax is a solution that will increase the amount of water in your bowels to assist with bowel movements.  Take as directed and can mix with a glass of water, juice, soda, coffee, or tea.  Take if you go more than two days without a movement. Do not use MiraLax more than once per day. Call your doctor if you are still constipated or irregular after using this medication for 7 days in a row.  If you continue to have problems with postoperative constipation, please contact the office for further assistance and recommendations.  If you experience "the worst abdominal pain ever" or develop nausea or vomiting, please contact the office immediatly for further recommendations for treatment.  ITCHING  If you experience itching with your medications, try taking only a single pain pill, or even half a pain pill at a time.  You can also use Benadryl over the counter for itching or also to help with sleep.   TED HOSE STOCKINGS Wear the elastic stockings on both legs for six weeks following surgery during the day but you may remove then at night for sleeping.  MEDICATIONS See your medication summary on the "After Visit Summary" that the nursing staff will review with you prior to discharge.  You may have some home medications which will be placed on hold until you complete the course of blood  thinner medication.  It is important for you to complete the blood thinner medication as prescribed by your surgeon.  Continue your approved medications as instructed at time of discharge.  PRECAUTIONS If you experience chest pain or shortness of breath - call 911 immediately for transfer to the hospital emergency department.  If you develop a fever greater that 101 F, purulent drainage from wound, increased redness or drainage from wound, foul odor from the wound/dressing, or calf pain - CONTACT YOUR SURGEON.                                                   FOLLOW-UP APPOINTMENTS Make sure you keep all of your appointments after your operation with your surgeon and caregivers. You should call the office at the above phone number and make an appointment for approximately two weeks after the date of your surgery or on the date instructed by your surgeon outlined in the "After Visit Summary".  RANGE OF MOTION AND STRENGTHENING EXERCISES  These exercises are designed to help you keep full movement of your hip joint. Follow your caregiver's or physical therapist's instructions. Perform all exercises about fifteen times, three times per day or as directed. Exercise both hips, even if you have had only one joint replacement. These exercises can be done on a training (exercise) mat, on the floor, on a table or on a bed. Use whatever works the best and is most comfortable for you. Use music or television while you are exercising so that the exercises are a pleasant break in your day. This will make your life better with the exercises acting  as a break in routine you can look forward to.  Lying on your back, slowly slide your foot toward your buttocks, raising your knee up off the floor. Then slowly slide your foot back down until your leg is straight again.  Lying on your back spread your legs as far apart as you can without causing discomfort.  Lying on your side, raise your upper leg and foot straight up  from the floor as far as is comfortable. Slowly lower the leg and repeat.  Lying on your back, tighten up the muscle in the front of your thigh (quadriceps muscles). You can do this by keeping your leg straight and trying to raise your heel off the floor. This helps strengthen the largest muscle supporting your knee.  Lying on your back, tighten up the muscles of your buttocks both with the legs straight and with the knee bent at a comfortable angle while keeping your heel on the floor.   IF YOU ARE TRANSFERRED TO A SKILLED REHAB FACILITY If the patient is transferred to a skilled rehab facility following release from the hospital, a list of the current medications will be sent to the facility for the patient to continue.  When discharged from the skilled rehab facility, please have the facility set up the patient's [place] prior to being released. Also, the skilled facility will be responsible for providing the patient with their medications at time of release from the facility to include their pain medication, the muscle relaxants, and their blood thinner medication. If the patient is still at the rehab facility at time of the two week follow up appointment, the skilled rehab facility will also need to assist the patient in arranging follow up appointment in our office and any transportation needs.  MAKE SURE YOU:  Understand these instructions.  Get help right away if you are not doing well or get worse.    Pick up stool softner and laxative for home use following surgery while on pain medications. Continue to use ice for pain and swelling after surgery. Do not use any lotions or creams on the incision until instructed by your surgeon.

## 2020-05-11 NOTE — Discharge Summary (Signed)
Physician Discharge Summary  Lonnie Clarke VZD:[id number] DOB: 03/25/1941 DOA: 05/07/2020  PCP: Ezequiel Kayser, MD  Admit date: 05/07/2020 Discharge date: 05/11/2020  Admitted From: home  Disposition: home w/ home health   Recommendations for Outpatient Follow-up:  1. Follow up with PCP in 1-2 weeks 2. F/u ortho surg, Dr. Rudene Christians, in 2 weeks   Home Health: yes Equipment/Devices: walker, 3N1  Discharge Condition: stable  CODE STATUS: full  Diet recommendation: Heart Healthy / Carb Modified  Brief/Interim Summary: HPI was taken from Dr. Roel Cluck: Lonnie Clarke is a 79 y.o. male with medical history significant ofhyperlipidemia hypertension diabetes , PAD, CVA, HOH    Presented with  Left hip pain after mechanical fall Was moving boxes and run into a door that knocked him backwards. He ha sudden severe pain in left hip. Unable to bear weight, no numbness no paresthesia No head injury no LOC On aspirin not on anticoagulation  No DOE, no Cp he goes to the gym everyday, he works out every day   Pt was not found for 1 hour he was sitting in the heat  does not smoke, drinks 2 beers a night, no hx of withdraw   Infectious risk factors:  Reports none  Has  been fully vaccinated against [place] [facility] from Dr. Lenise Herald 7/28-7/30/21: Pt was found to have a left hip fracture after a fall at home. Pt is s/p left total hip replacement on 05/08/20. Pt tolerated the surgery fairly well. PT/OT saw the pt and recommended home health. Home health was set up by CM prior to d/c. Of note, pt was found to have AKI that resolved prior to admission, although pt's baseline Cr/GFR is unknown. For more information, please see previous progress notes.    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  PT/OT recs home health. Mobility and pain are improving   Normocytic anemia: w/ likely a component post op anemia. No need for a transfusion currently. Will continue to monitor   Abnormal EKG: echo shows EF 60-65%, indeterminate diastolic function, no wall motion abnormalities.   HTN: restart quinapril at d/c. Hydralazine prn   Hyperlipidemia: continue on statin  PAD: continue on statin, aspirin   AKI: resolved  DM2: well controlled w/ HbA1c 5.5. Continue on home dose of glimepiride at d/c   Thrombocytopenia: etiology unknown. Labile. Will continue to monitor   Leukocytosis: resolved  Discharge Instructions  Discharge Instructions    Diet - low sodium heart healthy   Complete by: As directed    Diet Carb Modified   Complete by: As directed    Discharge instructions   Complete by: As directed    F/u PCP in 1-2 weeks. F/u ortho surg, Dr. Rudene Christians, in 2 weeks. Remove Praveena negative pressure dressing on 05/17/2020 and apply honeycomb dressing.   Discharge wound care:   Complete by: As directed    As stated in d/c instructions   Increase activity slowly   Complete by: As directed      Allergies as of 05/11/2020      Reactions   Amlodipine Other (See Comments)   Irritation of gums  Metoprolol Other (See Comments)   Fatigue    Ramipril Nausea Only   Triamterene-hctz Other (See Comments)   Weakness    Valsartan Other (See Comments)   Irritation of lips      Medication List    TAKE these medications   aspirin 81 MG chewable tablet Chew 81 mg by mouth daily.   enoxaparin 40 MG/0.4ML injection Commonly known as: LOVENOX Inject 0.4 mLs (40 mg total) into the skin daily for 14 days.   glimepiride 2 MG tablet Commonly known as: AMARYL Take 2 mg by mouth daily with breakfast.   hydrochlorothiazide 12.5 MG tablet Commonly known as:  HYDRODIURIL Take 12.5 mg by mouth daily.   HYDROcodone-acetaminophen 5-325 MG tablet Commonly known as: NORCO/VICODIN Take 1-2 tablets by mouth every 6 (six) hours as needed for moderate pain.   pravastatin 20 MG tablet Commonly known as: PRAVACHOL Take 20 mg by mouth at bedtime.   quinapril 10 MG tablet Commonly known as: ACCUPRIL Take 10 mg by mouth 2 (two) times daily.   traMADol 50 MG tablet Commonly known as: ULTRAM Take 1 tablet (50 mg total) by mouth every 6 (six) hours as needed.   Vitamin D (Ergocalciferol) 1.25 MG (50000 UNIT) Caps capsule Commonly known as: DRISDOL Take 50,000 Units by mouth once a week.            Durable Medical Equipment  (From admission, onward)         Start     Ordered   05/08/20 1337  DME Walker rolling  Once       Question Answer Comment  Walker: With Holley Wheels   Patient needs a walker to treat with the following condition S/P hip replacement      05/08/20 1336   05/08/20 1337  DME 3 n 1  Once        05/08/20 1336   05/08/20 1337  DME Bedside commode  Once       Question:  Patient needs a bedside commode to treat with the following condition  Answer:  S/P hip replacement   05/08/20 1336           Discharge Care Instructions  (From admission, onward)         Start     Ordered   05/11/20 0000  Discharge wound care:       Comments: As stated in d/c instructions   05/11/20 1238          Follow-up Information    Duanne Guess, PA-C Follow up in 2 week(s).   Specialties: Orthopedic Surgery, Emergency Medicine Contact information: [place] [address] [phone]        Ezequiel Kayser, MD Follow up in 2 week(s).   Specialty: Internal Medicine Contact information: [place] [address] [phone]              Allergies  Allergen Reactions  . Amlodipine Other (See Comments)    Irritation of gums  . Metoprolol Other  (See Comments)    Fatigue   . Ramipril Nausea Only  . Triamterene-Hctz Other (See Comments)    Weakness   . Valsartan Other (See Comments)    Irritation of lips    Consultations: Ortho surg  Procedures/Studies: DG Chest 1 View  Result Date: 05/07/2020 CLINICAL DATA:  Mechanical fall at home EXAM: CHEST  1 VIEW COMPARISON:  None. FINDINGS: Mildly low lung volumes. No  focal opacity or pleural effusion. Borderline cardiomegaly with aortic atherosclerosis. No pneumothorax. IMPRESSION: No active disease. Borderline cardiomegaly. Electronically Signed   By: Donavan Foil M.D.   On: 05/07/2020 17:37   ECHOCARDIOGRAM COMPLETE  Result Date: 05/09/2020    ECHOCARDIOGRAM REPORT   Patient Name:   Lonnie Clarke Date of Exam: 05/08/2020 Medical Rec #:  [id number]      Height:       73.0 in Accession #:    [id number]     Weight:       195.0 lb Date of Birth:  11/24/1940      BSA:          2.129 m Patient Age:    58 years       BP:           131/70 mmHg Patient Gender: M              HR:           77 bpm. Exam Location:  [facility] Procedure: 2D Echo, Cardiac Doppler and Color Doppler Indications:     Abnormal ECG 794.31  History:         Patient has no prior history of Echocardiogram examinations.                  Risk Factors:Hypertension and Dyslipidemia.  Sonographer:     Sherrie Sport RDCS (AE) Referring Phys:  [place] Diagnosing Phys: Ida Rogue MD  Sonographer Comments: no apical window and no subcostal window. IMPRESSIONS  1. Left ventricular ejection fraction, by estimation, is 60 to 65%. The left ventricle has normal function. The left ventricle has no regional wall motion abnormalities. Left ventricular diastolic parameters are indeterminate.  2. Right ventricular systolic function is normal. The right ventricular size is normal.  3. Challenging images FINDINGS  Left Ventricle: Left ventricular ejection fraction, by estimation, is 60 to 65%. The left ventricle has normal function. The left  ventricle has no regional wall motion abnormalities. The left ventricular internal cavity size was normal in size. There is  no left ventricular hypertrophy. Left ventricular diastolic parameters are indeterminate. Right Ventricle: The right ventricular size is normal. No increase in right ventricular wall thickness. Right ventricular systolic function is normal. Left Atrium: Left atrial size was normal in size. Right Atrium: Right atrial size was normal in size. Pericardium: There is no evidence of pericardial effusion. Mitral Valve: The mitral valve is normal in structure. Normal mobility of the mitral valve leaflets. No evidence of mitral valve regurgitation. No evidence of mitral valve stenosis. There is no evidence of mitral valve vegetation. Tricuspid Valve: The tricuspid valve is normal in structure. Tricuspid valve regurgitation is not demonstrated. No evidence of tricuspid stenosis. There is no evidence of tricuspid valve vegetation. Aortic Valve: The aortic valve is normal in structure. Aortic valve regurgitation is not visualized. Mild aortic valve sclerosis is present, with no evidence of aortic valve stenosis. There is no evidence of aortic valve vegetation. Pulmonic Valve: The pulmonic valve was normal in structure. Pulmonic valve regurgitation is not visualized. No evidence of pulmonic stenosis. There is no evidence of pulmonic valve vegetation. Aorta: The aortic root is normal in size and structure. Venous: The pulmonary veins were not well visualized. The inferior vena cava is normal in size with greater than 50% respiratory variability, suggesting right atrial pressure of 3 mmHg. IAS/Shunts: No atrial level shunt detected by color flow Doppler.  LEFT VENTRICLE PLAX 2D LVIDd:  4.72 cm LVIDs:         2.88 cm LV PW:         1.17 cm LV IVS:        1.27 cm LVOT diam:     2.10 cm LVOT Area:     3.46 cm  LEFT ATRIUM         Index LA diam:    3.30 cm 1.55 cm/m   AORTA Ao Root diam: 3.50 cm  SHUNTS  Systemic Diam: 2.10 cm Ida Rogue MD Electronically signed by Ida Rogue MD Signature Date/Time: 05/09/2020/6:35:55 AM    Final    DG HIP OPERATIVE UNILAT W OR W/O PELVIS LEFT  Result Date: 05/08/2020 CLINICAL DATA:  Total left hip replacement. EXAM: OPERATIVE LEFT HIP (WITH PELVIS IF PERFORMED) 3 VIEWS TECHNIQUE: Fluoroscopic spot image(s) were submitted for interpretation post-operatively. COMPARISON:  05/07/2020. FINDINGS: Total left hip replacement. Hardware intact. Anatomic alignment. Fluoroscopy time 0 minutes 30 seconds. Radiation dose 9.8 mGy. IMPRESSION: Total left hip replacement.  Hardware intact.  Anatomic alignment. Electronically Signed   By: Marcello Moores  Register   On: 05/08/2020 11:39   DG HIP UNILAT W OR W/O PELVIS 2-3 VIEWS LEFT  Result Date: 05/08/2020 CLINICAL DATA:  Status post left hip replacement. EXAM: DG HIP (WITH OR WITHOUT PELVIS) 2-3V LEFT COMPARISON:  05/07/2020 FINDINGS: Sequelae of interval left total hip arthroplasty are identified. No dislocation or acute fracture is identified. Postoperative gas is noted in the surrounding soft tissues. Skin staples and a wound VAC are noted. Sequelae of previous left inguinal hernia repair are also again noted. IMPRESSION: Interval left total hip arthroplasty without evidence of acute osseous abnormality. Electronically Signed   By: Logan Bores M.D.   On: 05/08/2020 12:40   DG Hip Unilat With Pelvis 2-3 Views Left  Result Date: 05/07/2020 CLINICAL DATA:  Fall.  Decreased range of motion. EXAM: DG HIP (WITH OR WITHOUT PELVIS) 2-3V LEFT COMPARISON:  None. FINDINGS: Minimally comminuted subcapital left femur fracture with varus angulation and lateral displacement of distal fracture fragments. Impaction. No dislocation. Sacroiliac joints are symmetric. Surgical clips along the left pelvic sidewall. IMPRESSION: Impacted left femoral neck fracture as detailed above. Electronically Signed   By: Abigail Miyamoto M.D.   On: 05/07/2020 17:37        Subjective: Pt c/o constipation   Discharge Exam: Vitals:   05/11/20 0009 05/11/20 0748  BP: (!) 125/57 (!) 124/60  Pulse: 95 97  Resp: 13 14  Temp: 97.7 F (36.5 C) 98.5 F (36.9 C)  SpO2: 93% 95%   Vitals:   05/10/20 0836 05/10/20 1546 05/11/20 0009 05/11/20 0748  BP: 127/68 125/65 (!) 125/57 (!) 124/60  Pulse: 97 95 95 97  Resp: 17 16 13 14   Temp: 99.3 F (37.4 C) 98.9 F (37.2 C) 97.7 F (36.5 C) 98.5 F (36.9 C)  TempSrc: Oral Oral Oral Oral  SpO2: 91% 93% 93% 95%  Weight:      Height:        General: Pt is alert, awake, not in acute distress Cardiovascular: S1/S2 +, no rubs, no gallops Respiratory: CTA bilaterally, no wheezing, no rhonchi Abdominal: Soft, NT, ND, bowel sounds + Extremities: no cyanosis    The results of significant diagnostics from this hospitalization (including imaging, microbiology, ancillary and laboratory) are listed below for reference.     Microbiology: Recent Results (from the past 240 hour(s))  SARS Coronavirus 2 by RT PCR (hospital order, performed in [facility] hospital lab) Nasopharyngeal  Nasopharyngeal Swab     Status: None   Collection Time: 05/07/20  6:06 PM   Specimen: Nasopharyngeal Swab  Result Value Ref Range Status   SARS Coronavirus 2 NEGATIVE NEGATIVE Final    Comment: (NOTE) SARS-CoV-2 target nucleic acids are NOT DETECTED.  The SARS-CoV-2 RNA is generally detectable in upper and lower respiratory specimens during the acute phase of infection. The lowest concentration of SARS-CoV-2 viral copies this assay can detect is 250 copies / mL. A negative result does not preclude SARS-CoV-2 infection and should not be used as the sole basis for treatment or other patient management decisions.  A negative result may occur with improper specimen collection / handling, submission of specimen other than nasopharyngeal swab, presence of viral mutation(s) within the areas targeted by this assay, and inadequate  number of viral copies (<250 copies / mL). A negative result must be combined with clinical observations, patient history, and epidemiological information.  Fact Sheet for Patients:   StrictlyIdeas.no  Fact Sheet for Healthcare Providers: BankingDealers.co.za  This test is not yet approved or  cleared by the [place] FDA and has been authorized for detection and/or diagnosis of SARS-CoV-2 by FDA under an Emergency Use Authorization (EUA).  This EUA will remain in effect (meaning this test can be used) for the duration of the COVID-19 declaration under Section 564(b)(1) of the Act, 21 U.S.C. section 360bbb-3(b)(1), unless the authorization is terminated or revoked sooner.  Performed at [facility], [address] [place], [place] [address]   MRSA PCR Screening     Status: None   Collection Time: 05/08/20  3:18 AM   Specimen: Nasal Mucosa; Nasopharyngeal  Result Value Ref Range Status   MRSA by PCR NEGATIVE NEGATIVE Final    Comment:        The GeneXpert MRSA Assay (FDA approved for NASAL specimens only), is one component of a comprehensive MRSA colonization surveillance program. It is not intended to diagnose MRSA infection nor to guide or monitor treatment for MRSA infections. Performed at [facility], [address] [place], [place] [address]      Labs: BNP (last 3 results) No results for input(s): BNP in the last 8760 hours. Basic Metabolic Panel: Recent Labs  Lab 05/07/20 1800 05/08/20 0445 05/09/20 0456 05/10/20 0904 05/11/20 0507  NA 135 136 134* 133* 135  K 3.7 3.7 4.3 4.0 4.0  CL 100 100 100 99 101  CO2 25 25 27 25 27   GLUCOSE 158* 120* 130* 130* 119*  BUN 20 22 28* 33* 30*  CREATININE 1.35* 1.18 1.29* 1.08 1.09  CALCIUM 8.4* 8.2* 7.4* 7.6* 7.6*   Liver Function Tests: Recent Labs  Lab 05/07/20 1806  AST 28  ALT 25  ALKPHOS 54  BILITOT 1.0  PROT 7.9  ALBUMIN 4.1    No results for input(s): LIPASE, AMYLASE in the last 168 hours. No results for input(s): AMMONIA in the last 168 hours. CBC: Recent Labs  Lab 05/07/20 1800 05/08/20 0445 05/09/20 0456 05/10/20 0904 05/11/20 0507  WBC 11.9* 10.5 14.8* 11.8* 7.6  NEUTROABS 10.7*  --   --   --   --   HGB 14.9 13.3 10.1* 8.5* 8.1*  HCT 44.5 39.4 30.8* 24.5* 23.6*  MCV 95.5 94.5 97.8 93.2 93.7  PLT 147* 148* 139* 141* 146*   Cardiac Enzymes: No results for input(s): CKTOTAL, CKMB, CKMBINDEX, TROPONINI in the last 168 hours. BNP: Invalid input(s): POCBNP CBG: Recent Labs  Lab 05/10/20 2045 05/11/20 0002  05/11/20 0315 05/11/20 0356 05/11/20 0750  GLUCAP 128* 98 108* 119* 118*   D-Dimer No results for input(s): DDIMER in the last 72 hours. Hgb A1c No results for input(s): HGBA1C in the last 72 hours. Lipid Profile No results for input(s): CHOL, HDL, LDLCALC, TRIG, CHOLHDL, LDLDIRECT in the last 72 hours. Thyroid function studies No results for input(s): TSH, T4TOTAL, T3FREE, THYROIDAB in the last 72 hours.  Invalid input(s): FREET3 Anemia work up No results for input(s): VITAMINB12, FOLATE, FERRITIN, TIBC, IRON, RETICCTPCT in the last 72 hours. Urinalysis    Component Value Date/Time   APPEARANCEUR Clear 12/24/2016 1455   GLUCOSEU Negative 12/24/2016 1455   BILIRUBINUR Negative 12/24/2016 1455   PROTEINUR Negative 12/24/2016 1455   NITRITE Negative 12/24/2016 1455   LEUKOCYTESUR Negative 12/24/2016 1455   Sepsis Labs Invalid input(s): PROCALCITONIN,  WBC,  LACTICIDVEN Microbiology Recent Results (from the past 240 hour(s))  SARS Coronavirus 2 by RT PCR (hospital order, performed in [place] hospital lab) Nasopharyngeal Nasopharyngeal Swab     Status: None   Collection Time: 05/07/20  6:06 PM   Specimen: Nasopharyngeal Swab  Result Value Ref Range Status   SARS Coronavirus 2 NEGATIVE NEGATIVE Final    Comment: (NOTE) SARS-CoV-2 target nucleic acids are NOT DETECTED.  The  SARS-CoV-2 RNA is generally detectable in upper and lower respiratory specimens during the acute phase of infection. The lowest concentration of SARS-CoV-2 viral copies this assay can detect is 250 copies / mL. A negative result does not preclude SARS-CoV-2 infection and should not be used as the sole basis for treatment or other patient management decisions.  A negative result may occur with improper specimen collection / handling, submission of specimen other than nasopharyngeal swab, presence of viral mutation(s) within the areas targeted by this assay, and inadequate number of viral copies (<250 copies / mL). A negative result must be combined with clinical observations, patient history, and epidemiological information.  Fact Sheet for Patients:   StrictlyIdeas.no  Fact Sheet for Healthcare Providers: BankingDealers.co.za  This test is not yet approved or  cleared by the [place] FDA and has been authorized for detection and/or diagnosis of SARS-CoV-2 by FDA under an Emergency Use Authorization (EUA).  This EUA will remain in effect (meaning this test can be used) for the duration of the COVID-19 declaration under Section 564(b)(1) of the Act, 21 U.S.C. section 360bbb-3(b)(1), unless the authorization is terminated or revoked sooner.  Performed at [facility], [address] [place], [place] [address]   MRSA PCR Screening     Status: None   Collection Time: 05/08/20  3:18 AM   Specimen: Nasal Mucosa; Nasopharyngeal  Result Value Ref Range Status   MRSA by PCR NEGATIVE NEGATIVE Final    Comment:        The GeneXpert MRSA Assay (FDA approved for NASAL specimens only), is one component of a comprehensive MRSA colonization surveillance program. It is not intended to diagnose MRSA infection nor to guide or monitor treatment for MRSA infections. Performed at [facility], [address],  [place], [place] [address]      Time coordinating discharge: Over 30 minutes  SIGNED:   Wyvonnia Dusky, MD  [facility] 05/11/2020, 4:00 PM Pager   If 7PM-7AM, please contact night-coverage [email or web address]

## 2020-05-11 NOTE — Progress Notes (Signed)
° °  Subjective: 3 Days Post-Op Procedure(s) (LRB): ARTHROPLASTY BIPOLAR HIP (HEMIARTHROPLASTY) (Left) Patient reports pain as mild.   Patient is well, and has had no acute complaints or problems Denies any CP, SOB, ABD pain. We will continue therapy today.  Plan is to go Home after hospital stay.  Objective: Vital signs in last 24 hours: Temp:  [97.7 F (36.5 C)-99.3 F (37.4 C)] 98.5 F (36.9 C) (07/30 0748) Pulse Rate:  [95-97] 97 (07/30 0748) Resp:  [13-17] 14 (07/30 0748) BP: (124-127)/(57-68) 124/60 (07/30 0748) SpO2:  [91 %-95 %] 95 % (07/30 0748)  Intake/Output from previous day: 07/29 0701 - 07/30 0700 In: 120 [P.O.:120] Out: 200 [Urine:200] Intake/Output this shift: Total I/O In: -  Out: 175 [Urine:175]  Recent Labs    05/09/20 0456 05/10/20 0904 05/11/20 0507  HGB 10.1* 8.5* 8.1*   Recent Labs    05/10/20 0904 05/11/20 0507  WBC 11.8* 7.6  RBC 2.63* 2.52*  HCT 24.5* 23.6*  PLT 141* 146*   Recent Labs    05/10/20 0904 05/11/20 0507  NA 133* 135  K 4.0 4.0  CL 99 101  CO2 25 27  BUN 33* 30*  CREATININE 1.08 1.09  GLUCOSE 130* 119*  CALCIUM 7.6* 7.6*   No results for input(s): LABPT, INR in the last 72 hours.  EXAM General - Patient is Alert, Appropriate and Oriented Extremity - Neurovascular intact Sensation intact distally Intact pulses distally Dorsiflexion/Plantar flexion intact No cellulitis present Compartment soft Dressing - dressing C/D/I and no drainage, Praveena intact without drainage Motor Function - intact, moving foot and toes well on exam.   Past Medical History:  Diagnosis Date   Hyperlipidemia    Hypertension     Assessment/Plan:   3 Days Post-Op Procedure(s) (LRB): ARTHROPLASTY BIPOLAR HIP (HEMIARTHROPLASTY) (Left) Active Problems:   Benign essential HTN   Pure hypercholesterolemia   Controlled type 2 diabetes mellitus without complication (HCC)   Hyperlipidemia   Closed left hip fracture (HCC)   AKI  (acute kidney injury) ([place])  Estimated body mass index is 25.73 kg/m as calculated from the following:   Height as of this encounter: 6\' 1"  (1.854 m).   Weight as of this encounter: 88.5 kg. Advance diet Up with therapy  Acute postop blood loss anemia-hemoglobin 8.1, stable. Pain well controlled Vital signs are stable Encourage incentive spirometer Work on bowel movement Care management to assist with discharge to home with HHPT today   Lovenox 40 mg subcu daily x14 days at discharge TED hose bilateral lower extremity x6 weeks Remove Praveena negative pressure dressing on 05/17/2020 and apply honeycomb dressing Follow-up with [facility] orthopedics 2 weeks postop   DVT Prophylaxis - Lovenox, TED hose and SCDs Weight-Bearing as tolerated to left leg   T. Rachelle Hora, PA-C [place] 05/11/2020, 8:16 AM

## 2020-05-11 NOTE — Progress Notes (Signed)
Patient ready for discharge, IV removed, tele removed. AVS given and reviewed with patient and wife verbalized understanding. No questions comments or concerns. Just awaiting transportation, wife to pull car to main entrance.

## 2020-05-11 NOTE — Progress Notes (Signed)
Patient says that he will wash later today when he gets home

## 2020-05-11 NOTE — Plan of Care (Signed)
  Problem: Education: Goal: Knowledge of General Education information will improve Description: Including pain rating scale, medication(s)/side effects and non-pharmacologic comfort measures Outcome: Progressing   Problem: Pain Managment: Goal: General experience of comfort will improve Outcome: Progressing   Problem: Safety: Goal: Ability to remain free from injury will improve Outcome: Progressing   

## 2020-05-11 NOTE — TOC Progression Note (Signed)
Transition of Care [facility]) - Progression Note    Patient Details  Name: KEETON KASSEBAUM MRN: [id number] Date of Birth: 02-08-1941  [facility] CM/SW Contact  Su Hilt, RN Phone Number: 05/11/2020, 3:01 PM  Clinical Narrative:   Helene Kelp with [facility] called and they are not able to see the patient due to staffing and the patient living in [facility] [place], [place], [place] and [facility] will not accept the patient, I called Corene Cornea with [facility], awaiting to see if they can accept    Expected Discharge Plan: [place] Barriers to Discharge: Continued Medical Work up  Expected Discharge Plan and Services Expected Discharge Plan: [place]   Discharge Planning Services: CM Consult   Living arrangements for the past 2 months: Single Family Home Expected Discharge Date: 05/11/20               DME Arranged: Gilford Rile rolling, 3-N-1 DME Agency: [employer] Date DME Agency Contacted: 05/09/20 Time DME Agency Contacted: [phone] Representative spoke with at DME Agency: zack HH Arranged: PT [place]: [facility] [employer]) Date [place]: 05/09/20 Time [place]: [place] Representative spoke with at [place]: [place] ([place]) Interventions    Readmission Risk Interventions No flowsheet data found.

## 2020-05-11 NOTE — Progress Notes (Signed)
Physical Therapy Treatment Patient Details Name: Lonnie Clarke MRN: [id number] DOB: 11-15-1940 Today's Date: 05/11/2020    History of Present Illness 79 y.o. male with a history of hyperlipidemia hypertension diabetes who comes the ED complaining of left hip pain after a mechanical fall while carrying boxes up steps and hitting door frame, falling backwards. 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 Patient is usually active and exercises at the gym frequently     PT Comments    Patient with good progress towards all mobility goals; performing all transfers, gait and stair activities with no greater than supervision level of assist from therapist.  Noted improvement in movement of L hip/knee with transitional movements; noted improvement in comfort/confidence with mobility. Patient/wife voice comfort with current performance and level of assist required; no further questions/concerns about upcoming discharge.  Safe for discharge to home environment from rehab perspective once cleared medically.    Follow Up Recommendations  Home health PT     Equipment Recommendations  Rolling walker with 5" wheels;3in1 (PT)    Recommendations for Other Services       Precautions / Restrictions Precautions Precautions: Fall;Anterior Hip Restrictions Weight Bearing Restrictions: Yes LLE Weight Bearing: Weight bearing as tolerated    Mobility  Bed Mobility Overal bed mobility: Needs Assistance Bed Mobility: Supine to Sit     Supine to sit: Supervision     General bed mobility comments: exit towards L side of bed, using 'hook technique' to self-assist L LE over edge of bed  Transfers Overall transfer level: Needs assistance Equipment used: Rolling walker (2 wheeled) Transfers: Sit to/from Stand Sit to Stand: Supervision         General transfer comment: improved fluidity of movement  Ambulation/Gait Ambulation/Gait assistance: Supervision Gait Distance (Feet):   (120' x1, 150' x1)         General Gait Details: partially reciprocal stepping pattern, decreased L heel strike/toe off, maintain L hip/knee flexion throughout gait cycle; mod WBing bilat Ues.  Cuing for postural extension and upward gaze as able   Stairs Stairs: Yes Stairs assistance: Supervision   Number of Stairs: 4 General stair comments: step to gait pattern; fair/good L LE control/stability in periods of modified SLS.  Patient voices improved comfort/confidence with stairs   Wheelchair Mobility    Modified Rankin (Stroke Patients Only)       Balance Overall balance assessment: Needs assistance Sitting-balance support: No upper extremity supported;Feet supported Sitting balance-Leahy Scale: Good     Standing balance support: Bilateral upper extremity supported Standing balance-Leahy Scale: Fair                              Cognition Arousal/Alertness: Awake/alert Behavior During Therapy: WFL for tasks assessed/performed Overall Cognitive Status: Within Functional Limits for tasks assessed                                        Exercises Other Exercises Other Exercises: Verbally reviewed car transfer technique, recommendations for continued use of RW until cleared by therapist/physician, activity recommendations/progression upon discharge, use of HEP; patient/wife voiced understanding and agreement.  Toilet transfer, ambulatory with RW, close sup; standing balance to empty bladder, sup/mod indep.  Good weight shift/stance to L LE in static standing activities.    General Comments        Pertinent Vitals/Pain Faces  Pain Scale: Hurts a little bit Pain Location: L hip Pain Descriptors / Indicators: Grimacing;Sore Pain Intervention(s): Limited activity within patient's tolerance;Monitored during session;Premedicated before session;Repositioned    Home Living                      Prior Function            PT Goals  (current goals can now be found in the care plan section) Acute Rehab PT Goals Patient Stated Goal: to go home  PT Goal Formulation: With patient Time For Goal Achievement: 05/22/20 Potential to Achieve Goals: Good Progress towards PT goals: Progressing toward goals    Frequency    BID      PT Plan Current plan remains appropriate    Co-evaluation              AM-PAC PT "6 Clicks" Mobility   Outcome Measure  Help needed turning from your back to your side while in a flat bed without using bedrails?: None Help needed moving from lying on your back to sitting on the side of a flat bed without using bedrails?: None Help needed moving to and from a bed to a chair (including a wheelchair)?: None Help needed standing up from a chair using your arms (e.g., wheelchair or bedside chair)?: None Help needed to walk in hospital room?: None Help needed climbing 3-5 steps with a railing? : A Little 6 Click Score: 23    End of Session Equipment Utilized During Treatment: Gait belt Activity Tolerance: Patient tolerated treatment well Patient left: with call bell/phone within reach;with chair alarm set;in bed Nurse Communication: Mobility status PT Visit Diagnosis: Muscle weakness (generalized) (M62.81);Difficulty in walking, not elsewhere classified (R26.2);Pain Pain - Right/Left: Left Pain - part of body: Hip     Time: 8850-2774 PT Time Calculation (min) (ACUTE ONLY): 31 min  Charges:  $Gait Training: 8-22 mins $Therapeutic Activity: 8-22 mins                     Jacqui Headen H. Owens Shark, PT, DPT, NCS 05/11/20, 11:06 AM [phone]

## 2020-05-12 LAB — GLUCOSE, CAPILLARY: Glucose-Capillary: 165 mg/dL — ABNORMAL HIGH (ref 70–99)

## 2020-05-14 ENCOUNTER — Other Ambulatory Visit: Payer: Self-pay | Admitting: *Deleted

## 2020-05-14 NOTE — Patient Outreach (Addendum)
[facility]) Care Management  05/14/2020  Lonnie Clarke [birth year] [id number]   Red on EMMI Alert   Day: #1 Date : 05/13/20 Reason :  Who reached Patient  Scheduled follow-up? No   Hospital Admission at [facility] 7/26-7/30/21 for  Left total hip replacement on 05/08/20.   Outreach attempt #1 Successful outreach call to patient explained [facility] care management services ,  reason for the call and interactive voice response calls received after discharge.  Addressed reason for the call , patient states that he has schedule appointment with Lonnie Clarke, his PCP on 8/11 and with Lonnie Clarke , orthopedic on 8/11 also.   Patient discussed that he was doing okay at home, pain tolerable with prescribed pain medications. He reports continuing to use a walker at home, working on the exercise taught doing hospital stay. He began to discuss having therapy at home, he then placed his wife Lonnie Clarke on the phone. Lonnie Clarke states that she has spoken to someone from [facility] over the weekend and was told she would received a call 1st of week. She/patient  agreed that I could follow with agency regarding services.  Placed call to [facility] spoke with Lonnie Clarke that explained that they have a delay in therapy services, and they are unable to accept patient at this time.   1630 Returned call patient to discuss call to [facility], patient then began to discuss getting a call today , he was unable to recall name. He discussed getting a call today unable to recall person name and  explained they discussed outpatient therapy he states that he is not ready for that right now and representative will follow up with [facility].  Noted inpatient CM note regarding home health and  call to [facility] on 7/30 .    Plan Will plan follow up call on next business day for collaboration regarding home health services needs.    Lonnie Draft, RN, BSN  [place] Management  Coordinator  [phone]- Mobile [phone]- Toll Free Main Office

## 2020-05-15 ENCOUNTER — Other Ambulatory Visit: Payer: Self-pay | Admitting: *Deleted

## 2020-05-15 NOTE — Patient Outreach (Signed)
[facility] [place] [facility]) Care Management  05/15/2020  Lonnie Clarke 25-Apr-1941 [id number]   Red on EMMI Alert   Day: #1 Date : 05/13/20 Reason :  Who reached Patient  Scheduled follow-up? No   Hospital Admission at [facility] 7/26-7/30/21 for  Left total hip replacement on 05/08/20.   Subjective: Successful follow up outreach call to patient, he reports that he that he is doing pretty good. He continues to work on exercise and states that he has learned that he needs to take his pain medication to it under control so that he can exercise. He discussed that he is getting up about each hour to take a walk.   Patient discussed receiving a phone call on this morning from someone from a [place] agency that will begin to visit his home for therapy services by the weekend. He states that he will receive a call by Friday, 8/6 for a visit on weekend, he is unable to recall name of Agency, due to receiving many calls.   Patient denies any other concerns at this time encouraged to contact orthopedic office is concerns arise related to recent surgery. He denies concerns related to surgical area.   Plan Patient agreeable to return  call in the next week to follow up on beginning home health therapy.    Joylene Draft, RN, BSN  [place] Management Coordinator  [phone]- Mobile [phone]- Toll Free Main Office

## 2020-05-18 DIAGNOSIS — I10 Essential (primary) hypertension: Secondary | ICD-10-CM | POA: Diagnosis not present

## 2020-05-18 DIAGNOSIS — E1151 Type 2 diabetes mellitus with diabetic peripheral angiopathy without gangrene: Secondary | ICD-10-CM | POA: Diagnosis not present

## 2020-05-18 DIAGNOSIS — H9190 Unspecified hearing loss, unspecified ear: Secondary | ICD-10-CM | POA: Diagnosis not present

## 2020-05-18 DIAGNOSIS — E785 Hyperlipidemia, unspecified: Secondary | ICD-10-CM | POA: Diagnosis not present

## 2020-05-18 DIAGNOSIS — S72002D Fracture of unspecified part of neck of left femur, subsequent encounter for closed fracture with routine healing: Secondary | ICD-10-CM | POA: Diagnosis not present

## 2020-05-18 DIAGNOSIS — Z7984 Long term (current) use of oral hypoglycemic drugs: Secondary | ICD-10-CM | POA: Diagnosis not present

## 2020-05-18 DIAGNOSIS — Z8673 Personal history of transient ischemic attack (TIA), and cerebral infarction without residual deficits: Secondary | ICD-10-CM | POA: Diagnosis not present

## 2020-05-18 DIAGNOSIS — Z96642 Presence of left artificial hip joint: Secondary | ICD-10-CM | POA: Diagnosis not present

## 2020-05-18 DIAGNOSIS — Z87891 Personal history of nicotine dependence: Secondary | ICD-10-CM | POA: Diagnosis not present

## 2020-05-21 ENCOUNTER — Other Ambulatory Visit: Payer: Self-pay | Admitting: *Deleted

## 2020-05-21 NOTE — Patient Outreach (Addendum)
[facility]) Care Management  05/21/2020  ION GONNELLA 06/01/1941 [id number]   Telephone follow up call   Red on EMMI Alert   Day: #1 Date : 05/13/20 Reason : Who reached Patient  Scheduled follow-up? No   Hospital Admission at [facility] 7/26-7/30/21 for Left total hip replacement on 05/08/20   Subjective: Successful follow up call to patient, verified that he has had initial home health physical therapy visit on 05/18/20. He reports that he is doing okay otherwise. Pain controlled with prn Medications.  Patient discussed having visit with PCP and orthopedic MD on this week. His wife will be able to provide transportation.  Patient denies any other concerns at this time.   Plan Will case closure at this time.    Joylene Draft, RN, BSN  [place] Management Coordinator  [phone]- Mobile [phone]- Toll Free Main Office

## 2020-05-22 DIAGNOSIS — Z87891 Personal history of nicotine dependence: Secondary | ICD-10-CM | POA: Diagnosis not present

## 2020-05-22 DIAGNOSIS — Z8673 Personal history of transient ischemic attack (TIA), and cerebral infarction without residual deficits: Secondary | ICD-10-CM | POA: Diagnosis not present

## 2020-05-22 DIAGNOSIS — Z96642 Presence of left artificial hip joint: Secondary | ICD-10-CM | POA: Diagnosis not present

## 2020-05-22 DIAGNOSIS — E1151 Type 2 diabetes mellitus with diabetic peripheral angiopathy without gangrene: Secondary | ICD-10-CM | POA: Diagnosis not present

## 2020-05-22 DIAGNOSIS — Z7984 Long term (current) use of oral hypoglycemic drugs: Secondary | ICD-10-CM | POA: Diagnosis not present

## 2020-05-22 DIAGNOSIS — H9190 Unspecified hearing loss, unspecified ear: Secondary | ICD-10-CM | POA: Diagnosis not present

## 2020-05-22 DIAGNOSIS — E785 Hyperlipidemia, unspecified: Secondary | ICD-10-CM | POA: Diagnosis not present

## 2020-05-22 DIAGNOSIS — S72002D Fracture of unspecified part of neck of left femur, subsequent encounter for closed fracture with routine healing: Secondary | ICD-10-CM | POA: Diagnosis not present

## 2020-05-22 DIAGNOSIS — I10 Essential (primary) hypertension: Secondary | ICD-10-CM | POA: Diagnosis not present

## 2020-05-23 DIAGNOSIS — E559 Vitamin D deficiency, unspecified: Secondary | ICD-10-CM | POA: Diagnosis not present

## 2020-05-23 DIAGNOSIS — N183 Chronic kidney disease, stage 3 unspecified: Secondary | ICD-10-CM | POA: Diagnosis not present

## 2020-05-23 DIAGNOSIS — I129 Hypertensive chronic kidney disease with stage 1 through stage 4 chronic kidney disease, or unspecified chronic kidney disease: Secondary | ICD-10-CM | POA: Diagnosis not present

## 2020-05-23 DIAGNOSIS — D62 Acute posthemorrhagic anemia: Secondary | ICD-10-CM | POA: Diagnosis not present

## 2020-05-23 DIAGNOSIS — Z0000 Encounter for general adult medical examination without abnormal findings: Secondary | ICD-10-CM | POA: Diagnosis not present

## 2020-05-23 DIAGNOSIS — D696 Thrombocytopenia, unspecified: Secondary | ICD-10-CM | POA: Diagnosis not present

## 2020-05-23 DIAGNOSIS — E1122 Type 2 diabetes mellitus with diabetic chronic kidney disease: Secondary | ICD-10-CM | POA: Diagnosis not present

## 2020-05-23 DIAGNOSIS — N1831 Chronic kidney disease, stage 3a: Secondary | ICD-10-CM | POA: Diagnosis not present

## 2020-05-23 DIAGNOSIS — E7800 Pure hypercholesterolemia, unspecified: Secondary | ICD-10-CM | POA: Diagnosis not present

## 2020-05-23 DIAGNOSIS — Z79899 Other long term (current) drug therapy: Secondary | ICD-10-CM | POA: Diagnosis not present

## 2020-05-23 DIAGNOSIS — E1169 Type 2 diabetes mellitus with other specified complication: Secondary | ICD-10-CM | POA: Diagnosis not present

## 2020-05-24 DIAGNOSIS — Z7984 Long term (current) use of oral hypoglycemic drugs: Secondary | ICD-10-CM | POA: Diagnosis not present

## 2020-05-24 DIAGNOSIS — E785 Hyperlipidemia, unspecified: Secondary | ICD-10-CM | POA: Diagnosis not present

## 2020-05-24 DIAGNOSIS — Z96642 Presence of left artificial hip joint: Secondary | ICD-10-CM | POA: Diagnosis not present

## 2020-05-24 DIAGNOSIS — E1151 Type 2 diabetes mellitus with diabetic peripheral angiopathy without gangrene: Secondary | ICD-10-CM | POA: Diagnosis not present

## 2020-05-24 DIAGNOSIS — Z87891 Personal history of nicotine dependence: Secondary | ICD-10-CM | POA: Diagnosis not present

## 2020-05-24 DIAGNOSIS — I10 Essential (primary) hypertension: Secondary | ICD-10-CM | POA: Diagnosis not present

## 2020-05-24 DIAGNOSIS — Z8673 Personal history of transient ischemic attack (TIA), and cerebral infarction without residual deficits: Secondary | ICD-10-CM | POA: Diagnosis not present

## 2020-05-24 DIAGNOSIS — S72002D Fracture of unspecified part of neck of left femur, subsequent encounter for closed fracture with routine healing: Secondary | ICD-10-CM | POA: Diagnosis not present

## 2020-05-24 DIAGNOSIS — H9190 Unspecified hearing loss, unspecified ear: Secondary | ICD-10-CM | POA: Diagnosis not present

## 2020-05-25 DIAGNOSIS — Z7984 Long term (current) use of oral hypoglycemic drugs: Secondary | ICD-10-CM | POA: Diagnosis not present

## 2020-05-25 DIAGNOSIS — E785 Hyperlipidemia, unspecified: Secondary | ICD-10-CM | POA: Diagnosis not present

## 2020-05-25 DIAGNOSIS — E1151 Type 2 diabetes mellitus with diabetic peripheral angiopathy without gangrene: Secondary | ICD-10-CM | POA: Diagnosis not present

## 2020-05-25 DIAGNOSIS — Z87891 Personal history of nicotine dependence: Secondary | ICD-10-CM | POA: Diagnosis not present

## 2020-05-25 DIAGNOSIS — Z8673 Personal history of transient ischemic attack (TIA), and cerebral infarction without residual deficits: Secondary | ICD-10-CM | POA: Diagnosis not present

## 2020-05-25 DIAGNOSIS — H9190 Unspecified hearing loss, unspecified ear: Secondary | ICD-10-CM | POA: Diagnosis not present

## 2020-05-25 DIAGNOSIS — I10 Essential (primary) hypertension: Secondary | ICD-10-CM | POA: Diagnosis not present

## 2020-05-25 DIAGNOSIS — S72002D Fracture of unspecified part of neck of left femur, subsequent encounter for closed fracture with routine healing: Secondary | ICD-10-CM | POA: Diagnosis not present

## 2020-05-25 DIAGNOSIS — Z96642 Presence of left artificial hip joint: Secondary | ICD-10-CM | POA: Diagnosis not present

## 2020-05-27 DIAGNOSIS — H9190 Unspecified hearing loss, unspecified ear: Secondary | ICD-10-CM | POA: Diagnosis not present

## 2020-05-27 DIAGNOSIS — Z96642 Presence of left artificial hip joint: Secondary | ICD-10-CM | POA: Diagnosis not present

## 2020-05-27 DIAGNOSIS — I10 Essential (primary) hypertension: Secondary | ICD-10-CM | POA: Diagnosis not present

## 2020-05-27 DIAGNOSIS — Z7984 Long term (current) use of oral hypoglycemic drugs: Secondary | ICD-10-CM | POA: Diagnosis not present

## 2020-05-27 DIAGNOSIS — S72002D Fracture of unspecified part of neck of left femur, subsequent encounter for closed fracture with routine healing: Secondary | ICD-10-CM | POA: Diagnosis not present

## 2020-05-27 DIAGNOSIS — E1151 Type 2 diabetes mellitus with diabetic peripheral angiopathy without gangrene: Secondary | ICD-10-CM | POA: Diagnosis not present

## 2020-05-27 DIAGNOSIS — Z8673 Personal history of transient ischemic attack (TIA), and cerebral infarction without residual deficits: Secondary | ICD-10-CM | POA: Diagnosis not present

## 2020-05-27 DIAGNOSIS — E785 Hyperlipidemia, unspecified: Secondary | ICD-10-CM | POA: Diagnosis not present

## 2020-05-27 DIAGNOSIS — Z87891 Personal history of nicotine dependence: Secondary | ICD-10-CM | POA: Diagnosis not present

## 2020-05-29 DIAGNOSIS — E1151 Type 2 diabetes mellitus with diabetic peripheral angiopathy without gangrene: Secondary | ICD-10-CM | POA: Diagnosis not present

## 2020-05-29 DIAGNOSIS — Z8673 Personal history of transient ischemic attack (TIA), and cerebral infarction without residual deficits: Secondary | ICD-10-CM | POA: Diagnosis not present

## 2020-05-29 DIAGNOSIS — S72002D Fracture of unspecified part of neck of left femur, subsequent encounter for closed fracture with routine healing: Secondary | ICD-10-CM | POA: Diagnosis not present

## 2020-05-29 DIAGNOSIS — H9190 Unspecified hearing loss, unspecified ear: Secondary | ICD-10-CM | POA: Diagnosis not present

## 2020-05-29 DIAGNOSIS — I10 Essential (primary) hypertension: Secondary | ICD-10-CM | POA: Diagnosis not present

## 2020-05-29 DIAGNOSIS — Z96642 Presence of left artificial hip joint: Secondary | ICD-10-CM | POA: Diagnosis not present

## 2020-05-29 DIAGNOSIS — Z87891 Personal history of nicotine dependence: Secondary | ICD-10-CM | POA: Diagnosis not present

## 2020-05-29 DIAGNOSIS — Z7984 Long term (current) use of oral hypoglycemic drugs: Secondary | ICD-10-CM | POA: Diagnosis not present

## 2020-05-29 DIAGNOSIS — E785 Hyperlipidemia, unspecified: Secondary | ICD-10-CM | POA: Diagnosis not present

## 2020-06-01 DIAGNOSIS — Z96642 Presence of left artificial hip joint: Secondary | ICD-10-CM | POA: Diagnosis not present

## 2020-06-01 DIAGNOSIS — Z87891 Personal history of nicotine dependence: Secondary | ICD-10-CM | POA: Diagnosis not present

## 2020-06-01 DIAGNOSIS — Z7984 Long term (current) use of oral hypoglycemic drugs: Secondary | ICD-10-CM | POA: Diagnosis not present

## 2020-06-01 DIAGNOSIS — H9190 Unspecified hearing loss, unspecified ear: Secondary | ICD-10-CM | POA: Diagnosis not present

## 2020-06-01 DIAGNOSIS — S72002D Fracture of unspecified part of neck of left femur, subsequent encounter for closed fracture with routine healing: Secondary | ICD-10-CM | POA: Diagnosis not present

## 2020-06-01 DIAGNOSIS — E785 Hyperlipidemia, unspecified: Secondary | ICD-10-CM | POA: Diagnosis not present

## 2020-06-01 DIAGNOSIS — E1151 Type 2 diabetes mellitus with diabetic peripheral angiopathy without gangrene: Secondary | ICD-10-CM | POA: Diagnosis not present

## 2020-06-01 DIAGNOSIS — Z8673 Personal history of transient ischemic attack (TIA), and cerebral infarction without residual deficits: Secondary | ICD-10-CM | POA: Diagnosis not present

## 2020-06-01 DIAGNOSIS — I10 Essential (primary) hypertension: Secondary | ICD-10-CM | POA: Diagnosis not present

## 2020-06-20 DIAGNOSIS — M79605 Pain in left leg: Secondary | ICD-10-CM | POA: Diagnosis not present

## 2020-11-23 DIAGNOSIS — N183 Chronic kidney disease, stage 3 unspecified: Secondary | ICD-10-CM | POA: Diagnosis not present

## 2020-11-23 DIAGNOSIS — Z79899 Other long term (current) drug therapy: Secondary | ICD-10-CM | POA: Diagnosis not present

## 2020-11-23 DIAGNOSIS — Z96642 Presence of left artificial hip joint: Secondary | ICD-10-CM | POA: Diagnosis not present

## 2020-11-23 DIAGNOSIS — E1122 Type 2 diabetes mellitus with diabetic chronic kidney disease: Secondary | ICD-10-CM | POA: Diagnosis not present

## 2020-11-23 DIAGNOSIS — E1169 Type 2 diabetes mellitus with other specified complication: Secondary | ICD-10-CM | POA: Diagnosis not present

## 2020-11-23 DIAGNOSIS — E559 Vitamin D deficiency, unspecified: Secondary | ICD-10-CM | POA: Diagnosis not present

## 2020-11-23 DIAGNOSIS — N1831 Chronic kidney disease, stage 3a: Secondary | ICD-10-CM | POA: Diagnosis not present

## 2020-11-23 DIAGNOSIS — D696 Thrombocytopenia, unspecified: Secondary | ICD-10-CM | POA: Diagnosis not present

## 2020-11-23 DIAGNOSIS — I10 Essential (primary) hypertension: Secondary | ICD-10-CM | POA: Diagnosis not present

## 2020-11-23 DIAGNOSIS — E7800 Pure hypercholesterolemia, unspecified: Secondary | ICD-10-CM | POA: Diagnosis not present

## 2020-12-19 DIAGNOSIS — H903 Sensorineural hearing loss, bilateral: Secondary | ICD-10-CM | POA: Diagnosis not present

## 2020-12-20 DIAGNOSIS — H90A22 Sensorineural hearing loss, unilateral, left ear, with restricted hearing on the contralateral side: Secondary | ICD-10-CM | POA: Diagnosis not present

## 2020-12-20 DIAGNOSIS — H6123 Impacted cerumen, bilateral: Secondary | ICD-10-CM | POA: Diagnosis not present

## 2020-12-24 DIAGNOSIS — H903 Sensorineural hearing loss, bilateral: Secondary | ICD-10-CM | POA: Diagnosis not present

## 2021-06-05 DIAGNOSIS — E1122 Type 2 diabetes mellitus with diabetic chronic kidney disease: Secondary | ICD-10-CM | POA: Diagnosis not present

## 2021-06-05 DIAGNOSIS — Z8673 Personal history of transient ischemic attack (TIA), and cerebral infarction without residual deficits: Secondary | ICD-10-CM | POA: Diagnosis not present

## 2021-06-05 DIAGNOSIS — Z0000 Encounter for general adult medical examination without abnormal findings: Secondary | ICD-10-CM | POA: Diagnosis not present

## 2021-06-05 DIAGNOSIS — E1159 Type 2 diabetes mellitus with other circulatory complications: Secondary | ICD-10-CM | POA: Diagnosis not present

## 2021-06-05 DIAGNOSIS — N1831 Chronic kidney disease, stage 3a: Secondary | ICD-10-CM | POA: Diagnosis not present

## 2021-06-05 DIAGNOSIS — M5412 Radiculopathy, cervical region: Secondary | ICD-10-CM | POA: Diagnosis not present

## 2021-06-05 DIAGNOSIS — E559 Vitamin D deficiency, unspecified: Secondary | ICD-10-CM | POA: Diagnosis not present

## 2021-06-05 DIAGNOSIS — Z79899 Other long term (current) drug therapy: Secondary | ICD-10-CM | POA: Diagnosis not present

## 2021-06-05 DIAGNOSIS — I152 Hypertension secondary to endocrine disorders: Secondary | ICD-10-CM | POA: Diagnosis not present

## 2021-06-05 DIAGNOSIS — E7800 Pure hypercholesterolemia, unspecified: Secondary | ICD-10-CM | POA: Diagnosis not present

## 2021-06-05 DIAGNOSIS — Z87442 Personal history of urinary calculi: Secondary | ICD-10-CM | POA: Diagnosis not present

## 2021-06-05 DIAGNOSIS — Z96642 Presence of left artificial hip joint: Secondary | ICD-10-CM | POA: Diagnosis not present

## 2021-06-13 DIAGNOSIS — H2513 Age-related nuclear cataract, bilateral: Secondary | ICD-10-CM | POA: Diagnosis not present

## 2021-11-20 DIAGNOSIS — N183 Chronic kidney disease, stage 3 unspecified: Secondary | ICD-10-CM | POA: Diagnosis not present

## 2021-11-20 DIAGNOSIS — Z8673 Personal history of transient ischemic attack (TIA), and cerebral infarction without residual deficits: Secondary | ICD-10-CM | POA: Diagnosis not present

## 2021-11-20 DIAGNOSIS — E559 Vitamin D deficiency, unspecified: Secondary | ICD-10-CM | POA: Diagnosis not present

## 2021-11-20 DIAGNOSIS — E1159 Type 2 diabetes mellitus with other circulatory complications: Secondary | ICD-10-CM | POA: Diagnosis not present

## 2021-11-20 DIAGNOSIS — Z96642 Presence of left artificial hip joint: Secondary | ICD-10-CM | POA: Diagnosis not present

## 2021-11-20 DIAGNOSIS — M5412 Radiculopathy, cervical region: Secondary | ICD-10-CM | POA: Diagnosis not present

## 2021-11-20 DIAGNOSIS — E1169 Type 2 diabetes mellitus with other specified complication: Secondary | ICD-10-CM | POA: Diagnosis not present

## 2021-11-20 DIAGNOSIS — E7800 Pure hypercholesterolemia, unspecified: Secondary | ICD-10-CM | POA: Diagnosis not present

## 2021-11-20 DIAGNOSIS — Z87442 Personal history of urinary calculi: Secondary | ICD-10-CM | POA: Diagnosis not present

## 2021-11-20 DIAGNOSIS — E1122 Type 2 diabetes mellitus with diabetic chronic kidney disease: Secondary | ICD-10-CM | POA: Diagnosis not present

## 2021-12-03 DIAGNOSIS — H90A22 Sensorineural hearing loss, unilateral, left ear, with restricted hearing on the contralateral side: Secondary | ICD-10-CM | POA: Diagnosis not present

## 2021-12-03 DIAGNOSIS — H6123 Impacted cerumen, bilateral: Secondary | ICD-10-CM | POA: Diagnosis not present

## 2021-12-10 DIAGNOSIS — E1122 Type 2 diabetes mellitus with diabetic chronic kidney disease: Secondary | ICD-10-CM | POA: Diagnosis not present

## 2021-12-10 DIAGNOSIS — Z87891 Personal history of nicotine dependence: Secondary | ICD-10-CM | POA: Diagnosis not present

## 2021-12-10 DIAGNOSIS — E559 Vitamin D deficiency, unspecified: Secondary | ICD-10-CM | POA: Diagnosis not present

## 2021-12-10 DIAGNOSIS — N1831 Chronic kidney disease, stage 3a: Secondary | ICD-10-CM | POA: Diagnosis not present

## 2021-12-10 DIAGNOSIS — E1169 Type 2 diabetes mellitus with other specified complication: Secondary | ICD-10-CM | POA: Diagnosis not present

## 2021-12-10 DIAGNOSIS — Z23 Encounter for immunization: Secondary | ICD-10-CM | POA: Diagnosis not present

## 2021-12-10 DIAGNOSIS — Z09 Encounter for follow-up examination after completed treatment for conditions other than malignant neoplasm: Secondary | ICD-10-CM | POA: Diagnosis not present

## 2021-12-10 DIAGNOSIS — E7800 Pure hypercholesterolemia, unspecified: Secondary | ICD-10-CM | POA: Diagnosis not present

## 2021-12-10 DIAGNOSIS — I130 Hypertensive heart and chronic kidney disease with heart failure and stage 1 through stage 4 chronic kidney disease, or unspecified chronic kidney disease: Secondary | ICD-10-CM | POA: Diagnosis not present

## 2022-01-24 DIAGNOSIS — J22 Unspecified acute lower respiratory infection: Secondary | ICD-10-CM | POA: Diagnosis not present

## 2022-01-24 DIAGNOSIS — R059 Cough, unspecified: Secondary | ICD-10-CM | POA: Diagnosis not present

## 2022-01-24 DIAGNOSIS — R051 Acute cough: Secondary | ICD-10-CM | POA: Diagnosis not present

## 2022-01-24 DIAGNOSIS — R6883 Chills (without fever): Secondary | ICD-10-CM | POA: Diagnosis not present

## 2022-02-05 DIAGNOSIS — R051 Acute cough: Secondary | ICD-10-CM | POA: Diagnosis not present

## 2022-02-05 DIAGNOSIS — J069 Acute upper respiratory infection, unspecified: Secondary | ICD-10-CM | POA: Diagnosis not present

## 2022-04-10 DIAGNOSIS — Z85828 Personal history of other malignant neoplasm of skin: Secondary | ICD-10-CM | POA: Diagnosis not present

## 2022-04-10 DIAGNOSIS — Z859 Personal history of malignant neoplasm, unspecified: Secondary | ICD-10-CM | POA: Diagnosis not present

## 2022-04-10 DIAGNOSIS — C44612 Basal cell carcinoma of skin of right upper limb, including shoulder: Secondary | ICD-10-CM | POA: Diagnosis not present

## 2022-04-10 DIAGNOSIS — D485 Neoplasm of uncertain behavior of skin: Secondary | ICD-10-CM | POA: Diagnosis not present

## 2022-04-10 DIAGNOSIS — C44319 Basal cell carcinoma of skin of other parts of face: Secondary | ICD-10-CM | POA: Diagnosis not present

## 2022-05-08 DIAGNOSIS — C44319 Basal cell carcinoma of skin of other parts of face: Secondary | ICD-10-CM | POA: Diagnosis not present

## 2022-05-22 DIAGNOSIS — H5213 Myopia, bilateral: Secondary | ICD-10-CM | POA: Diagnosis not present

## 2022-05-22 DIAGNOSIS — H524 Presbyopia: Secondary | ICD-10-CM | POA: Diagnosis not present

## 2022-06-02 DIAGNOSIS — H5213 Myopia, bilateral: Secondary | ICD-10-CM | POA: Diagnosis not present

## 2022-06-03 DIAGNOSIS — C44612 Basal cell carcinoma of skin of right upper limb, including shoulder: Secondary | ICD-10-CM | POA: Diagnosis not present

## 2022-06-09 DIAGNOSIS — Z8673 Personal history of transient ischemic attack (TIA), and cerebral infarction without residual deficits: Secondary | ICD-10-CM | POA: Diagnosis not present

## 2022-06-09 DIAGNOSIS — Z125 Encounter for screening for malignant neoplasm of prostate: Secondary | ICD-10-CM | POA: Diagnosis not present

## 2022-06-09 DIAGNOSIS — E559 Vitamin D deficiency, unspecified: Secondary | ICD-10-CM | POA: Diagnosis not present

## 2022-06-09 DIAGNOSIS — N183 Chronic kidney disease, stage 3 unspecified: Secondary | ICD-10-CM | POA: Diagnosis not present

## 2022-06-09 DIAGNOSIS — M5412 Radiculopathy, cervical region: Secondary | ICD-10-CM | POA: Diagnosis not present

## 2022-06-09 DIAGNOSIS — I10 Essential (primary) hypertension: Secondary | ICD-10-CM | POA: Diagnosis not present

## 2022-06-09 DIAGNOSIS — E7800 Pure hypercholesterolemia, unspecified: Secondary | ICD-10-CM | POA: Diagnosis not present

## 2022-06-09 DIAGNOSIS — Z87442 Personal history of urinary calculi: Secondary | ICD-10-CM | POA: Diagnosis not present

## 2022-06-09 DIAGNOSIS — Z0000 Encounter for general adult medical examination without abnormal findings: Secondary | ICD-10-CM | POA: Diagnosis not present

## 2022-06-09 DIAGNOSIS — E1122 Type 2 diabetes mellitus with diabetic chronic kidney disease: Secondary | ICD-10-CM | POA: Diagnosis not present

## 2022-06-09 DIAGNOSIS — Z1389 Encounter for screening for other disorder: Secondary | ICD-10-CM | POA: Diagnosis not present

## 2022-06-09 DIAGNOSIS — E1169 Type 2 diabetes mellitus with other specified complication: Secondary | ICD-10-CM | POA: Diagnosis not present

## 2022-06-09 DIAGNOSIS — I152 Hypertension secondary to endocrine disorders: Secondary | ICD-10-CM | POA: Diagnosis not present

## 2022-06-09 DIAGNOSIS — E1159 Type 2 diabetes mellitus with other circulatory complications: Secondary | ICD-10-CM | POA: Diagnosis not present

## 2022-06-09 DIAGNOSIS — Z96642 Presence of left artificial hip joint: Secondary | ICD-10-CM | POA: Diagnosis not present

## 2022-06-12 DIAGNOSIS — E1159 Type 2 diabetes mellitus with other circulatory complications: Secondary | ICD-10-CM | POA: Diagnosis not present

## 2022-06-12 DIAGNOSIS — Z79899 Other long term (current) drug therapy: Secondary | ICD-10-CM | POA: Diagnosis not present

## 2022-06-12 DIAGNOSIS — I152 Hypertension secondary to endocrine disorders: Secondary | ICD-10-CM | POA: Diagnosis not present

## 2022-06-12 DIAGNOSIS — N1831 Chronic kidney disease, stage 3a: Secondary | ICD-10-CM | POA: Diagnosis not present

## 2022-06-12 DIAGNOSIS — E1122 Type 2 diabetes mellitus with diabetic chronic kidney disease: Secondary | ICD-10-CM | POA: Diagnosis not present

## 2022-10-14 ENCOUNTER — Ambulatory Visit
Admission: EM | Admit: 2022-10-14 | Discharge: 2022-10-14 | Disposition: A | Discharge status: Home / Self Care | Payer: Medicare HMO | Attending: Family Medicine | Admitting: Family Medicine

## 2022-10-14 ENCOUNTER — Encounter: Payer: Self-pay | Admitting: Emergency Medicine

## 2022-10-14 ENCOUNTER — Telehealth: Payer: Self-pay | Admitting: Family Medicine

## 2022-10-14 ENCOUNTER — Ambulatory Visit (INDEPENDENT_AMBULATORY_CARE_PROVIDER_SITE_OTHER): Payer: Medicare HMO

## 2022-10-14 DIAGNOSIS — Z1152 Encounter for screening for COVID-19: Secondary | ICD-10-CM | POA: Insufficient documentation

## 2022-10-14 DIAGNOSIS — R140 Abdominal distension (gaseous): Secondary | ICD-10-CM

## 2022-10-14 DIAGNOSIS — R1110 Vomiting, unspecified: Secondary | ICD-10-CM

## 2022-10-14 DIAGNOSIS — R112 Nausea with vomiting, unspecified: Secondary | ICD-10-CM

## 2022-10-14 DIAGNOSIS — J101 Influenza due to other identified influenza virus with other respiratory manifestations: Secondary | ICD-10-CM | POA: Diagnosis not present

## 2022-10-14 DIAGNOSIS — R059 Cough, unspecified: Secondary | ICD-10-CM

## 2022-10-14 LAB — RESP PANEL BY RT-PCR (RSV, FLU A&B, COVID)  RVPGX2
Influenza A by PCR: POSITIVE — AB
Influenza B by PCR: NEGATIVE
Resp Syncytial Virus by PCR: NEGATIVE
SARS Coronavirus 2 by RT PCR: NEGATIVE

## 2022-10-14 MED ORDER — PROMETHAZINE HCL 25 MG/ML IJ SOLN: 12.5000 mg | Freq: Once | INTRAMUSCULAR | Status: DC

## 2022-10-14 MED ORDER — ONDANSETRON HCL 4 MG PO TABS: 4.0000 mg | ORAL_TABLET | Freq: Four times a day (QID) | ORAL | 0 refills | Status: DC

## 2022-10-14 MED ORDER — ONDANSETRON HCL 4 MG/2ML IJ SOLN
4.0000 mg | Freq: Once | INTRAMUSCULAR | Status: AC
  Administered 2022-10-14: 4 mg via INTRAMUSCULAR

## 2022-10-14 MED ORDER — HYDROCODONE BIT-HOMATROP MBR 5-1.5 MG/5ML PO SOLN: 5.0000 mL | Freq: Four times a day (QID) | ORAL | 0 refills | Status: DC | PRN

## 2022-10-14 NOTE — ED Provider Notes (Signed)
[facility]    CSN: [id number] Arrival date & time: 10/14/22  0801      History   Chief Complaint Chief Complaint  Patient presents with   Cough   Fever    HPI Lonnie Clarke is a 82 y.o. male.   HPI   Lonnie [facility] presents for ongoing flu-like symptoms (fever, productive cough and body aches) for the past week.  He hasn't been eating well. Last night, he coughed so bad his wife insisted he take one of her Tamiflu. His wife and son both have influenza. His flu-like symptoms started last Wednesday morning.  Symptoms kept gept getting worse until Saturday. Felt good yesterday.  Then this morning he started vomiting.  Has abdominal bloating. Denies abdominal pain.  Feels like his symptoms are worse than they were before.   Former smoker - 43 years but quit 20 years       Past Medical History:  Diagnosis Date   Hyperlipidemia    Hypertension     Patient Active Problem List   Diagnosis Date Noted   Closed left hip fracture ([place]) 05/07/2020   AKI (acute kidney injury) ([place]) 05/07/2020   Leg pain 09/07/2017   Atherosclerosis of native arteries of extremity with intermittent claudication ([place]) 07/07/2017   Cerebrovascular accident (CVA) ([place]) 12/10/2015   Microhematuria 12/10/2015   Hyperlipidemia    Controlled type 2 diabetes mellitus without complication ([place]) 37/07/6268   Other long term (current) drug therapy 11/19/2014   Anxiety about health 05/19/2014   Benign essential HTN 05/19/2014   Pure hypercholesterolemia 05/19/2014    Past Surgical History:  Procedure Laterality Date   HERNIA REPAIR  2013   HIP ARTHROPLASTY Left 05/08/2020   Procedure: ARTHROPLASTY BIPOLAR HIP (HEMIARTHROPLASTY);  Surgeon: Hessie Knows, MD;  Location: [facility] ORS;  Service: Orthopedics;  Laterality: Left;       Home Medications    Prior to Admission medications   Medication Sig Start Date End Date Taking? Authorizing Provider  aspirin 81 MG chewable tablet Chew 81 mg by mouth  daily.    Yes [provider]  hydrochlorothiazide (HYDRODIURIL) 12.5 MG tablet Take 12.5 mg by mouth daily.  11/23/15  Yes [provider]  lisinopril (ZESTRIL) 20 MG tablet Take by mouth. 08/15/22  Yes [provider]  ondansetron (ZOFRAN) 4 MG tablet Take 1 tablet (4 mg total) by mouth every 6 (six) hours. 10/14/22  Yes Reizy Dunlow, DO  rosuvastatin (CRESTOR) 20 MG tablet Take by mouth. 08/19/22 08/19/23 Yes [provider]  enoxaparin (LOVENOX) 40 MG/0.4ML injection Inject 0.4 mLs (40 mg total) into the skin daily for 14 days. 05/10/20 05/24/20  Duanne Guess, PA-C  glimepiride (AMARYL) 2 MG tablet Take 2 mg by mouth daily with breakfast.    [provider]  HYDROcodone-acetaminophen (NORCO/VICODIN) 5-325 MG tablet Take 1-2 tablets by mouth every 6 (six) hours as needed for moderate pain. 05/10/20   Duanne Guess, PA-C  pravastatin (PRAVACHOL) 20 MG tablet Take 20 mg by mouth at bedtime.  05/22/15   [provider]  quinapril (ACCUPRIL) 10 MG tablet Take 10 mg by mouth 2 (two) times daily.  11/23/15   [provider]  traMADol (ULTRAM) 50 MG tablet Take 1 tablet (50 mg total) by mouth every 6 (six) hours as needed. 05/10/20   Duanne Guess, PA-C  Vitamin D, Ergocalciferol, (DRISDOL) 1.25 MG (50000 UNIT) CAPS capsule Take 50,000 Units by mouth once a week. 02/28/20   [provider]  Family History Family History  Problem Relation Age of Onset   Prostate cancer Neg Hx    Bladder Cancer Neg Hx     Social History Social History   Tobacco Use   Smoking status: Former    Types: Cigarettes    Quit date: 12/10/1999    Years since quitting: 22.8   Smokeless tobacco: Never  Vaping Use   Vaping Use: Never used  Substance Use Topics   Alcohol use: Yes    Alcohol/week: 0.0 standard drinks of alcohol   Drug use: No     Allergies   Amlodipine, Metoprolol, Ramipril, Triamterene-hctz, and Valsartan   Review of  Systems Review of Systems: negative unless otherwise stated in HPI.      Physical Exam Triage Vital Signs ED Triage Vitals  Enc Vitals Group     BP 10/14/22 0825 (!) 146/75     Pulse Rate 10/14/22 0825 62     Resp 10/14/22 0825 18     Temp 10/14/22 0825 97.9 F (36.6 C)     Temp Source 10/14/22 0825 Oral     SpO2 10/14/22 0825 97 %     Weight 10/14/22 0822 195 lb 1.7 oz (88.5 kg)     Height 10/14/22 0822 '6\' 1"'$  (1.854 m)     Head Circumference --      Peak Flow --      Pain Score 10/14/22 0820 4     Pain Loc --      Pain Edu? --      Excl. in [place]? --    No data found.  Updated Vital Signs BP (!) 146/75 (BP Location: Left Arm)   Pulse 62   Temp 97.9 F (36.6 C) (Oral)   Resp 18   Ht '6\' 1"'$  (1.854 m)   Wt 88.5 kg   SpO2 97%   BMI 25.74 kg/m   Visual Acuity Right Eye Distance:   Left Eye Distance:   Bilateral Distance:    Right Eye Near:   Left Eye Near:    Bilateral Near:     Physical Exam GEN:     alert, non-toxic appearing male in no distress    HENT:  mucus membranes moist, oropharyngeal without erythema, lesions or exudate, no tonsillar hypertrophy, clear nasal discharge EYES:   no scleral injection or discharge NECK:  normal ROM, no meningismus   RESP:  no increased work of breathing, coarse breath sounds, no wheezing CVS:   regular rate and rhythm ABD:   soft, non-tender, mildly distended, active bowel sounds, no guarding, no rebound  Skin:   warm and dry    UC Treatments / Results  Labs (all labs ordered are listed, but only abnormal results are displayed) Labs Reviewed  RESP PANEL BY RT-PCR (RSV, FLU A&B, COVID)  RVPGX2 - Abnormal; Notable for the following components:      Result Value   Influenza A by PCR POSITIVE (*)    All other components within normal limits    EKG   Radiology DG Abdomen 1 View  Result Date: 10/14/2022 CLINICAL DATA:  Vomiting and bloating with runny nose, fever and body aches. EXAM: ABDOMEN - 1 VIEW COMPARISON:  No  recent comparison imaging aside from chest radiograph. Prior abdominal CT from 2017. FINDINGS: Signs of LEFT inguinal herniorrhaphy and LEFT hip replacement. Potential RIGHT interpolar calculi not well assessed, suggestion of collection of calcifications in the interpolar RIGHT kidney up to 3 mm. No signs of bowel dilation to suggest obstruction  or ileus. No gross organomegaly. On limited assessment no acute skeletal process with extensive spinal degenerative changes and osteopenia. IMPRESSION: 1. No acute findings in the abdomen. 2. Potential RIGHT interpolar calculi not well assessed, suggestion of collection of calcifications in the interpolar RIGHT kidney up to 3 mm. Electronically Signed   By: Zetta Bills M.D.   On: 10/14/2022 09:14   DG Chest 2 View  Result Date: 10/14/2022 CLINICAL DATA:  An 82 year old male presents with cough. EXAM: CHEST - 2 VIEW COMPARISON:  May 07, 2020 FINDINGS: Trachea midline. Cardiomediastinal contours and hilar structures are normal. Lungs are clear. No sign of pneumothorax. No visible pleural effusion. No free air beneath either the RIGHT or LEFT hemidiaphragm. Signs of pectus excavatum. No acute skeletal process on limited evaluation. IMPRESSION: 1. No acute cardiopulmonary disease. 2. Pectus excavatum. Electronically Signed   By: Zetta Bills M.D.   On: 10/14/2022 09:11    Procedures Procedures (including critical care time)  Medications Ordered in UC Medications  ondansetron (ZOFRAN) injection 4 mg (4 mg Intramuscular Given 10/14/22 1009)    Initial Impression / Assessment and Plan / UC Course  I have reviewed the triage vital signs and the nursing notes.  Pertinent labs & imaging results that were available during my care of the patient were reviewed by me and considered in my medical decision making (see chart for details).       Pt is a 82 y.o. male who presents for ongoing respiratory symptoms with exposure to influenza. [place]is is afebrile here  without recent antipyretics. Satting well on room air. Overall pt is ill but non-toxic appearing, well hydrated, without respiratory distress. Pulmonary exam is remarkable for coarse breath sounds therefore CXR obtained.  RSV, COVID and influenza testing obtained.  KUB for bloating in the setting of vomiting. Rule out small bowel obstruction or peritoneal air. Zofran IM for nausea.   He has influenza A.  Chest x-ray did not show a pneumonia, pleural effusion or pneumothorax. He without evidence of small bowel obstruction or diaphragmatic air.  He is influenza A positive.  He is outside the window for Tamiflu.  Tamiflu usage last night may have exacerbated his symptoms. Zofran prescribed for nausea/vomiting.  Discussed symptomatic treatment.  Explained lack of efficacy of antibiotics in viral disease.  Typical duration of symptoms discussed.   Return and ED precautions given and voiced understanding. Discussed MDM, treatment plan and plan for follow-up with patient who agrees with plan.     Final Clinical Impressions(s) / UC Diagnoses   Final diagnoses:  Nausea and vomiting, unspecified vomiting type  Bloating  Influenza A     Discharge Instructions      Your chest and belly x-rays did not show cause of your nausea and vomiting.  I suspect your symptoms may be related to taking the Tamiflu tablet from your wife.  I would avoid taking other Tamiflu tablets at this time.    Your chest x-ray did not show a pneumonia.  I sent some nausea medicine to your pharmacy.  We tested you for COVID, influenza and RSV.  If your test is positive, someone will call you with your result.      ED Prescriptions     Medication Sig Dispense Auth. Provider   ondansetron (ZOFRAN) 4 MG tablet Take 1 tablet (4 mg total) by mouth every 6 (six) hours. 12 tablet Lyndee Hensen, DO      PDMP not reviewed this encounter.   Lyndee Hensen, DO 10/14/22  1138  

## 2022-10-14 NOTE — Telephone Encounter (Signed)
Patient returned to the urgent care requesting antibiotics for his cough.  His influenza A test was positive and discussed again that antibiotics are not warranted for viruses.  He requested cough syrup that will help him sleep.  States he was given a cough syrup in April that helped him.  On chart review, he was prescribed to cough syrup someone with codeine 1 which hydrocodone and he request the 1 with hydrocodone.  Prescription sent to the pharmacy.  Prescribed Hydrocodone-homatropine (HYCODAN) 5-1.5 mg/5 mL syrup    Lyndee Hensen, DO

## 2022-10-14 NOTE — Discharge Instructions (Addendum)
Your chest and belly x-rays did not show cause of your nausea and vomiting.  I suspect your symptoms may be related to taking the Tamiflu tablet from your wife.  I would avoid taking other Tamiflu tablets at this time.    Your chest x-ray did not show a pneumonia.  I sent some nausea medicine to your pharmacy.  We tested you for COVID, influenza and RSV.  If your test is positive, someone will call you with your result.

## 2022-10-14 NOTE — ED Triage Notes (Addendum)
Pt c/o cough, runny nose, fever, nausea, body aches. Started about a week ago. He states his wife was here last week and tested positive for Flu A. Pt states he was getting better but then yesterday started feeling worse again. He states his wife gave him one of her tamiflu tablets last night.

## 2022-10-21 DIAGNOSIS — H6012 Cellulitis of left external ear: Secondary | ICD-10-CM | POA: Diagnosis not present

## 2022-10-21 DIAGNOSIS — H6123 Impacted cerumen, bilateral: Secondary | ICD-10-CM | POA: Diagnosis not present

## 2022-10-22 DIAGNOSIS — E1159 Type 2 diabetes mellitus with other circulatory complications: Secondary | ICD-10-CM | POA: Diagnosis not present

## 2022-10-22 DIAGNOSIS — Z79899 Other long term (current) drug therapy: Secondary | ICD-10-CM | POA: Diagnosis not present

## 2022-10-22 DIAGNOSIS — E1122 Type 2 diabetes mellitus with diabetic chronic kidney disease: Secondary | ICD-10-CM | POA: Diagnosis not present

## 2022-10-22 DIAGNOSIS — I152 Hypertension secondary to endocrine disorders: Secondary | ICD-10-CM | POA: Diagnosis not present

## 2022-10-22 DIAGNOSIS — E7800 Pure hypercholesterolemia, unspecified: Secondary | ICD-10-CM | POA: Diagnosis not present

## 2022-10-22 DIAGNOSIS — N1831 Chronic kidney disease, stage 3a: Secondary | ICD-10-CM | POA: Diagnosis not present

## 2022-10-27 ENCOUNTER — Encounter: Payer: Self-pay | Admitting: Otolaryngology

## 2022-10-27 DIAGNOSIS — D4819 Other specified neoplasm of uncertain behavior of connective and other soft tissue: Secondary | ICD-10-CM | POA: Diagnosis not present

## 2022-10-28 ENCOUNTER — Other Ambulatory Visit
Admission: RE | Admit: 2022-10-28 | Discharge: 2022-10-28 | Disposition: A | Discharge status: Home / Self Care | Payer: Medicare HMO | Attending: Urgent Care | Admitting: Urgent Care

## 2022-10-28 DIAGNOSIS — Z01812 Encounter for preprocedural laboratory examination: Secondary | ICD-10-CM | POA: Insufficient documentation

## 2022-10-28 DIAGNOSIS — Z79899 Other long term (current) drug therapy: Secondary | ICD-10-CM | POA: Diagnosis not present

## 2022-10-28 LAB — POTASSIUM: Potassium: 3.9 mmol/L (ref 3.5–5.1)

## 2022-10-31 ENCOUNTER — Ambulatory Visit: Payer: Medicare HMO | Admitting: Anesthesiology

## 2022-10-31 ENCOUNTER — Encounter: Payer: Self-pay | Admitting: Otolaryngology

## 2022-10-31 ENCOUNTER — Encounter
Admission: RE | Disposition: A | Discharge status: Home / Self Care | Payer: Self-pay | Source: Home / Self Care | Attending: Otolaryngology

## 2022-10-31 ENCOUNTER — Ambulatory Visit
Admission: RE | Admit: 2022-10-31 | Discharge: 2022-10-31 | Disposition: A | Discharge status: Home / Self Care | Payer: Medicare HMO | Attending: Otolaryngology | Admitting: Otolaryngology

## 2022-10-31 DIAGNOSIS — Z79899 Other long term (current) drug therapy: Secondary | ICD-10-CM

## 2022-10-31 DIAGNOSIS — C44229 Squamous cell carcinoma of skin of left ear and external auricular canal: Secondary | ICD-10-CM | POA: Diagnosis not present

## 2022-10-31 DIAGNOSIS — E785 Hyperlipidemia, unspecified: Secondary | ICD-10-CM | POA: Diagnosis not present

## 2022-10-31 DIAGNOSIS — Z87891 Personal history of nicotine dependence: Secondary | ICD-10-CM | POA: Insufficient documentation

## 2022-10-31 DIAGNOSIS — D4819 Other specified neoplasm of uncertain behavior of connective and other soft tissue: Secondary | ICD-10-CM | POA: Diagnosis not present

## 2022-10-31 DIAGNOSIS — I10 Essential (primary) hypertension: Secondary | ICD-10-CM | POA: Diagnosis not present

## 2022-10-31 DIAGNOSIS — Z01812 Encounter for preprocedural laboratory examination: Secondary | ICD-10-CM

## 2022-10-31 DIAGNOSIS — C44209 Unspecified malignant neoplasm of skin of left ear and external auricular canal: Secondary | ICD-10-CM | POA: Diagnosis not present

## 2022-10-31 HISTORY — DX: Other specified postprocedural states: Z98.890

## 2022-10-31 HISTORY — PX: EXCISION MASS HEAD: SHX6702

## 2022-10-31 HISTORY — DX: Nausea with vomiting, unspecified: R11.2

## 2022-10-31 SURGERY — EXCISION, MASS, HEAD
Anesthesia: General | Site: Ear | Laterality: Left

## 2022-10-31 MED ORDER — GLYCOPYRROLATE 0.2 MG/ML IJ SOLN
INTRAMUSCULAR | Status: DC | PRN
  Administered 2022-10-31: .2 mg via INTRAVENOUS

## 2022-10-31 MED ORDER — LIDOCAINE-EPINEPHRINE 1 %-1:100000 IJ SOLN
INTRAMUSCULAR | Status: DC | PRN
  Administered 2022-10-31: 4 mL

## 2022-10-31 MED ORDER — BACITRACIN 500 UNIT/GM EX OINT
TOPICAL_OINTMENT | CUTANEOUS | Status: DC | PRN
  Administered 2022-10-31: 1 via TOPICAL

## 2022-10-31 MED ORDER — LIDOCAINE HCL (CARDIAC) PF 100 MG/5ML IV SOSY
PREFILLED_SYRINGE | INTRAVENOUS | Status: DC | PRN
  Administered 2022-10-31: 60 mg via INTRATRACHEAL

## 2022-10-31 MED ORDER — LACTATED RINGERS IV SOLN: INTRAVENOUS | Status: DC | PRN

## 2022-10-31 MED ORDER — EPHEDRINE SULFATE (PRESSORS) 50 MG/ML IJ SOLN
INTRAMUSCULAR | Status: DC | PRN
  Administered 2022-10-31: 5 mg via INTRAVENOUS
  Administered 2022-10-31 (×2): 10 mg via INTRAVENOUS
  Administered 2022-10-31 (×5): 5 mg via INTRAVENOUS

## 2022-10-31 MED ORDER — FENTANYL CITRATE (PF) 100 MCG/2ML IJ SOLN
INTRAMUSCULAR | Status: DC | PRN
  Administered 2022-10-31 (×2): 50 ug via INTRAVENOUS

## 2022-10-31 MED ORDER — ONDANSETRON HCL 4 MG/2ML IJ SOLN
INTRAMUSCULAR | Status: DC | PRN
  Administered 2022-10-31: 4 mg via INTRAVENOUS

## 2022-10-31 MED ORDER — DEXAMETHASONE SODIUM PHOSPHATE 4 MG/ML IJ SOLN
INTRAMUSCULAR | Status: DC | PRN
  Administered 2022-10-31: 4 mg via INTRAVENOUS

## 2022-10-31 MED ORDER — HYDROCODONE-ACETAMINOPHEN 5-325 MG PO TABS: 1.0000 | ORAL_TABLET | Freq: Four times a day (QID) | ORAL | 0 refills | Status: AC | PRN

## 2022-10-31 MED ORDER — PROPOFOL 10 MG/ML IV BOLUS
INTRAVENOUS | Status: DC | PRN
  Administered 2022-10-31: 100 mg via INTRAVENOUS

## 2022-10-31 SURGICAL SUPPLY — 22 items
APL FBRTP 3 REG TIP STRL LF (MISCELLANEOUS) ×1
APPLICATOR COT TIP 3IN STERILE (MISCELLANEOUS) ×1
APPLICATOR COT TIP 3IN STRL (MISCELLANEOUS) IMPLANT
BNDG ADH 2 X3.75 FABRIC TAN LF (GAUZE/BANDAGES/DRESSINGS) IMPLANT
BNDG ADH XL 3.75X2 STRCH LF (GAUZE/BANDAGES/DRESSINGS) ×1
CORD BIP STRL DISP 12FT (MISCELLANEOUS) IMPLANT
DRAPE HEAD BAR (DRAPES) ×1 IMPLANT
GAUZE SPONGE 4X4 12PLY STRL (GAUZE/BANDAGES/DRESSINGS) IMPLANT
GLOVE SURG GAMMEX PI TX LF 7.5 (GLOVE) ×2 IMPLANT
GOWN STRL REUS W/ TWL LRG LVL3 (GOWN DISPOSABLE) ×1 IMPLANT
GOWN STRL REUS W/TWL LRG LVL3 (GOWN DISPOSABLE) ×1
KIT TURNOVER KIT A (KITS) ×1 IMPLANT
NDL HYPO 27GX1-1/4 (NEEDLE) IMPLANT
NEEDLE HYPO 27GX1-1/4 (NEEDLE) ×1 IMPLANT
NS IRRIG 500ML POUR BTL (IV SOLUTION) ×1 IMPLANT
PACK ENT CUSTOM (PACKS) ×1 IMPLANT
SOL PREP PVP 2OZ (MISCELLANEOUS) ×1
SOLUTION PREP PVP 2OZ (MISCELLANEOUS) ×1 IMPLANT
SPONGE KITTNER 5P (MISCELLANEOUS) IMPLANT
STRAP BODY AND KNEE 60X3 (MISCELLANEOUS) ×1 IMPLANT
SUT PROLENE 5 0 P 3 (SUTURE) ×1 IMPLANT
SUT VIC AB 5-0 PC1 18 (SUTURE) IMPLANT

## 2022-10-31 NOTE — Op Note (Signed)
10/31/2022  12:34 PM    Lonnie Clarke  [id number]   Pre-Op Dx: Left superior lateral auricular lesion 2 cm in length and 1.5 cm in width  Post-op Dx: Same  Proc: Excision lesion left upper outer auricle 2.4 cm in length, complex repair of auricular defect 2.4 x 5 cm in size  Surg:  Elon Alas Chay Mazzoni  Anes:  Gen by LMA  EBL: 20 mL  Comp: None  Findings: Rapidly growing mass on the outer border of his left upper auricle.  This was 2 cm in length and 1.5 cm in width and sticking out of the edge of the auricle.  It was extremely tender but not draining any.  A pie shaped full-thickness excision was done of the lesion extending into the upper third of the auricle.  The defect was 5 cm in length from the edge to the tip of the pie excision and 2.4 cm of height was removed at the auricular edge.  The specimen was marked with a stitch at the superior border a 2 mm margin was used all the way around the lesion superiorly and inferiorly.  Procedure: The patient was brought to the operating room and given general anesthesia by LMA.  Once the patient was asleep his head was turned to the right side to provide exposure to his left auricle.  The skin edges were marked with a 2 mm border above and below.  A pie shaped wedge excision was removed to facilitate the reconstruction and closure.  Once this was marked it was prepped and draped in sterile fashion and then 1% Xylocaine with epi 1: 100,000 was used for infiltration of the skin anterior and posterior to the lesion.  The pie shaped excision was created following the previous marked lines.  This was 2.4 cm at the auricular edge and then went to a point anteriorly near the front of the auricular cartilage.  Once the specimen was removed the tag was placed in the superior border at the irregular edge for orientation.  The skin edges were elevated around the cartilage anteriorly and posteriorly to facilitate closure.  The some of the cartilage anterior to  the point of the pie shaped incision was trimmed to free up the cartilage some and allow it to slide back together without tension.  The complex closure was done by now advancing the cartilage together and using 5-0 Vicryl's for pulling the cartilage together.  This was multiple through and through stitches that brought the cartilage together edge to edge but not allowing it to overlap.  This started anteriorly moved posteriorly to the edge to finally pull the cartilage together here.  The skin edges now draped over that and closed well.  There is slight excess of skin on the posterior superior border and this was trimmed of about 3 mm to allow the skin to fold back together.  5-0 Vicryl's were used for closing the dermis along the auricular tip to make sure this aligned well.  5-0 Vicryl was used to close the dermis posteriorly in the ear where the skin was much thicker.  A 5-0 Prolene was then used starting at the anterior border of the wound which was the pie shaped area.  This was used to pull full-thickness skin together in a running locking suture.  This extended along the front of the ear and then was used to close the auricular edges and then continue to extend around the back of the ear to close the postauricular  incision as well.  This completed the regular reconstruction and looked like a normal contour of the ear again although was about 25% smaller auricle than previously because of the loss of tissue.  Wound was washed and cleaned and then covered with some bacitracin.  A Band-Aid was placed over the auricle.  The patient tolerated the procedure well he was awakened and taken to the recovery room in satisfactory condition.  There were no operative complications.  The specimen was sent for permanent section    Dispo:   To PACU to be discharged home  Plan: To follow-up in the office in 6 days for suture removal.  He will rest at home with his head elevated and not lie on his left ear.  He will  keep it dry and clean and keep a Band-Aid over it all the time.  He can remove the Band-Aid as needed and placed further ointment and a Band-Aid again.  Elon Alas Jeanae Whitmill  10/31/2022 12:34 PM

## 2022-10-31 NOTE — Transfer of Care (Signed)
Immediate Anesthesia Transfer of Care Note  Patient: Lonnie Clarke  Procedure(s) Performed: EXCISION AURICULAR MASS (Left: Ear)  Patient Location: PACU  Anesthesia Type: General LMA  Level of Consciousness: awake, alert  and patient cooperative  Airway and Oxygen Therapy: Patient Spontanous Breathing and Patient connected to supplemental oxygen  Post-op Assessment: Post-op Vital signs reviewed, Patient's Cardiovascular Status Stable, Respiratory Function Stable, Patent Airway and No signs of Nausea or vomiting  Post-op Vital Signs: Reviewed and stable  Complications: No notable events documented.

## 2022-10-31 NOTE — Anesthesia Postprocedure Evaluation (Signed)
Anesthesia Post Note  Patient: Lonnie Clarke  Procedure(s) Performed: EXCISION AURICULAR MASS (Left: Ear)  Patient location during evaluation: PACU Anesthesia Type: General Level of consciousness: awake and alert Pain management: pain level controlled Vital Signs Assessment: post-procedure vital signs reviewed and stable Respiratory status: spontaneous breathing, nonlabored ventilation and respiratory function stable Cardiovascular status: blood pressure returned to baseline and stable Postop Assessment: no apparent nausea or vomiting Anesthetic complications: no   No notable events documented.   Last Vitals:  Vitals:   10/31/22 1234 10/31/22 1242  BP: 134/68   Pulse: (!) 106   Resp: 15   Temp: (!) 36.4 C (!) 36.4 C  SpO2: 96% 96%    Last Pain:  Vitals:   10/31/22 1242  PainSc: 0-No pain                 Iran Ouch

## 2022-10-31 NOTE — Anesthesia Preprocedure Evaluation (Addendum)
Anesthesia Evaluation  Patient identified by MRN, date of birth, ID band Patient awake    Reviewed: Allergy & Precautions, H&P , NPO status , Patient's Chart, lab work & pertinent test results  Airway Mallampati: II  TM Distance: >3 FB Neck ROM: full    Dental no notable dental hx.    Pulmonary former smoker   Pulmonary exam normal        Cardiovascular hypertension, Normal cardiovascular exam     Neuro/Psych negative neurological ROS  negative psych ROS   GI/Hepatic negative GI ROS, Neg liver ROS,,,  Endo/Other  negative endocrine ROS    Renal/GU      Musculoskeletal   Abdominal Normal abdominal exam  (+)   Peds  Hematology negative hematology ROS (+)   Anesthesia Other Findings Past Medical History: No date: Hyperlipidemia No date: Hypertension No date: PONV (postoperative nausea and vomiting)     Comment:  Many years ago  Past Surgical History: 2013: HERNIA REPAIR 05/08/2020: HIP ARTHROPLASTY; Left     Comment:  Procedure: ARTHROPLASTY BIPOLAR HIP (HEMIARTHROPLASTY);               Surgeon: Hessie Knows, MD;  Location: [facility] ORS;                Service: Orthopedics;  Laterality: Left;  BMI    Body Mass Index: 25.33 kg/m      Reproductive/Obstetrics negative OB ROS                             Anesthesia Physical Anesthesia Plan  ASA: 2  Anesthesia Plan: General LMA   Post-op Pain Management: Regional block   Induction: Intravenous  PONV Risk Score and Plan: 2 and Dexamethasone, Ondansetron and Midazolam  Airway Management Planned: LMA  Additional Equipment:   Intra-op Plan:   Post-operative Plan: Extubation in OR  Informed Consent: I have reviewed the patients History and Physical, chart, labs and discussed the procedure including the risks, benefits and alternatives for the proposed anesthesia with the patient or authorized representative who has indicated  his/her understanding and acceptance.     Dental Advisory Given  Plan Discussed with: Anesthesiologist, CRNA and Surgeon  Anesthesia Plan Comments:        Anesthesia Quick Evaluation

## 2022-10-31 NOTE — H&P (Signed)
H&P has been reviewed and patient reevaluated, no changes necessary. To be downloaded later.  

## 2022-10-31 NOTE — Anesthesia Procedure Notes (Signed)
Procedure Name: LMA Insertion Date/Time: 10/31/2022 11:32 AM  Performed by: Londell Moh, CRNAPre-anesthesia Checklist: Patient identified, Emergency Drugs available, Suction available, Timeout performed and Patient being monitored Patient Re-evaluated:Patient Re-evaluated prior to induction Oxygen Delivery Method: Circle system utilized Preoxygenation: Pre-oxygenation with 100% oxygen Induction Type: IV induction LMA: LMA inserted LMA Size: 4.0 Number of attempts: 1 Placement Confirmation: positive ETCO2 and breath sounds checked- equal and bilateral Tube secured with: Tape

## 2022-11-03 ENCOUNTER — Encounter: Payer: Self-pay | Admitting: Otolaryngology

## 2022-11-04 LAB — SURGICAL PATHOLOGY

## 2022-11-12 DIAGNOSIS — Z20822 Contact with and (suspected) exposure to covid-19: Secondary | ICD-10-CM | POA: Diagnosis not present

## 2022-11-12 DIAGNOSIS — R0981 Nasal congestion: Secondary | ICD-10-CM | POA: Diagnosis not present

## 2022-11-13 DIAGNOSIS — R0981 Nasal congestion: Secondary | ICD-10-CM | POA: Diagnosis not present

## 2022-11-13 DIAGNOSIS — Z20822 Contact with and (suspected) exposure to covid-19: Secondary | ICD-10-CM | POA: Diagnosis not present

## 2022-12-10 DIAGNOSIS — E1169 Type 2 diabetes mellitus with other specified complication: Secondary | ICD-10-CM | POA: Diagnosis not present

## 2022-12-10 DIAGNOSIS — N1831 Chronic kidney disease, stage 3a: Secondary | ICD-10-CM | POA: Diagnosis not present

## 2022-12-10 DIAGNOSIS — I10 Essential (primary) hypertension: Secondary | ICD-10-CM | POA: Diagnosis not present

## 2022-12-10 DIAGNOSIS — Z09 Encounter for follow-up examination after completed treatment for conditions other than malignant neoplasm: Secondary | ICD-10-CM | POA: Diagnosis not present

## 2022-12-10 DIAGNOSIS — E1122 Type 2 diabetes mellitus with diabetic chronic kidney disease: Secondary | ICD-10-CM | POA: Diagnosis not present

## 2022-12-10 DIAGNOSIS — E559 Vitamin D deficiency, unspecified: Secondary | ICD-10-CM | POA: Diagnosis not present

## 2022-12-10 DIAGNOSIS — Z87891 Personal history of nicotine dependence: Secondary | ICD-10-CM | POA: Diagnosis not present

## 2022-12-10 DIAGNOSIS — E7800 Pure hypercholesterolemia, unspecified: Secondary | ICD-10-CM | POA: Diagnosis not present

## 2022-12-10 DIAGNOSIS — R7989 Other specified abnormal findings of blood chemistry: Secondary | ICD-10-CM | POA: Diagnosis not present

## 2022-12-12 DIAGNOSIS — E1159 Type 2 diabetes mellitus with other circulatory complications: Secondary | ICD-10-CM | POA: Diagnosis not present

## 2022-12-12 DIAGNOSIS — I152 Hypertension secondary to endocrine disorders: Secondary | ICD-10-CM | POA: Diagnosis not present

## 2022-12-12 DIAGNOSIS — N1831 Chronic kidney disease, stage 3a: Secondary | ICD-10-CM | POA: Diagnosis not present

## 2022-12-12 DIAGNOSIS — E1122 Type 2 diabetes mellitus with diabetic chronic kidney disease: Secondary | ICD-10-CM | POA: Diagnosis not present

## 2022-12-12 DIAGNOSIS — I130 Hypertensive heart and chronic kidney disease with heart failure and stage 1 through stage 4 chronic kidney disease, or unspecified chronic kidney disease: Secondary | ICD-10-CM | POA: Diagnosis not present

## 2022-12-12 DIAGNOSIS — E7800 Pure hypercholesterolemia, unspecified: Secondary | ICD-10-CM | POA: Diagnosis not present

## 2022-12-18 DIAGNOSIS — H903 Sensorineural hearing loss, bilateral: Secondary | ICD-10-CM | POA: Diagnosis not present

## 2022-12-19 DIAGNOSIS — Z20822 Contact with and (suspected) exposure to covid-19: Secondary | ICD-10-CM | POA: Diagnosis not present

## 2022-12-19 DIAGNOSIS — R0981 Nasal congestion: Secondary | ICD-10-CM | POA: Diagnosis not present

## 2022-12-20 DIAGNOSIS — Z20822 Contact with and (suspected) exposure to covid-19: Secondary | ICD-10-CM | POA: Diagnosis not present

## 2022-12-20 DIAGNOSIS — R0981 Nasal congestion: Secondary | ICD-10-CM | POA: Diagnosis not present

## 2022-12-21 DIAGNOSIS — Z20822 Contact with and (suspected) exposure to covid-19: Secondary | ICD-10-CM | POA: Diagnosis not present

## 2022-12-21 DIAGNOSIS — R0981 Nasal congestion: Secondary | ICD-10-CM | POA: Diagnosis not present

## 2022-12-23 DIAGNOSIS — Z20822 Contact with and (suspected) exposure to covid-19: Secondary | ICD-10-CM | POA: Diagnosis not present

## 2022-12-23 DIAGNOSIS — R0981 Nasal congestion: Secondary | ICD-10-CM | POA: Diagnosis not present

## 2022-12-24 DIAGNOSIS — R0981 Nasal congestion: Secondary | ICD-10-CM | POA: Diagnosis not present

## 2022-12-24 DIAGNOSIS — Z20822 Contact with and (suspected) exposure to covid-19: Secondary | ICD-10-CM | POA: Diagnosis not present

## 2022-12-25 DIAGNOSIS — H903 Sensorineural hearing loss, bilateral: Secondary | ICD-10-CM | POA: Diagnosis not present

## 2022-12-27 DIAGNOSIS — Z20822 Contact with and (suspected) exposure to covid-19: Secondary | ICD-10-CM | POA: Diagnosis not present

## 2022-12-27 DIAGNOSIS — R0981 Nasal congestion: Secondary | ICD-10-CM | POA: Diagnosis not present

## 2022-12-31 DIAGNOSIS — R0981 Nasal congestion: Secondary | ICD-10-CM | POA: Diagnosis not present

## 2022-12-31 DIAGNOSIS — Z20822 Contact with and (suspected) exposure to covid-19: Secondary | ICD-10-CM | POA: Diagnosis not present

## 2023-01-02 DIAGNOSIS — R0981 Nasal congestion: Secondary | ICD-10-CM | POA: Diagnosis not present

## 2023-01-02 DIAGNOSIS — Z20822 Contact with and (suspected) exposure to covid-19: Secondary | ICD-10-CM | POA: Diagnosis not present

## 2023-01-26 DIAGNOSIS — E039 Hypothyroidism, unspecified: Secondary | ICD-10-CM | POA: Diagnosis not present

## 2023-06-12 DIAGNOSIS — M5412 Radiculopathy, cervical region: Secondary | ICD-10-CM | POA: Diagnosis not present

## 2023-06-12 DIAGNOSIS — Z1331 Encounter for screening for depression: Secondary | ICD-10-CM | POA: Diagnosis not present

## 2023-06-12 DIAGNOSIS — E7800 Pure hypercholesterolemia, unspecified: Secondary | ICD-10-CM | POA: Diagnosis not present

## 2023-06-12 DIAGNOSIS — E1159 Type 2 diabetes mellitus with other circulatory complications: Secondary | ICD-10-CM | POA: Diagnosis not present

## 2023-06-12 DIAGNOSIS — Z87442 Personal history of urinary calculi: Secondary | ICD-10-CM | POA: Diagnosis not present

## 2023-06-12 DIAGNOSIS — N183 Chronic kidney disease, stage 3 unspecified: Secondary | ICD-10-CM | POA: Diagnosis not present

## 2023-06-12 DIAGNOSIS — Z8673 Personal history of transient ischemic attack (TIA), and cerebral infarction without residual deficits: Secondary | ICD-10-CM | POA: Diagnosis not present

## 2023-06-12 DIAGNOSIS — I152 Hypertension secondary to endocrine disorders: Secondary | ICD-10-CM | POA: Diagnosis not present

## 2023-06-12 DIAGNOSIS — Z0000 Encounter for general adult medical examination without abnormal findings: Secondary | ICD-10-CM | POA: Diagnosis not present

## 2023-06-12 DIAGNOSIS — N1831 Chronic kidney disease, stage 3a: Secondary | ICD-10-CM | POA: Diagnosis not present

## 2023-06-12 DIAGNOSIS — E559 Vitamin D deficiency, unspecified: Secondary | ICD-10-CM | POA: Diagnosis not present

## 2023-06-12 DIAGNOSIS — Z125 Encounter for screening for malignant neoplasm of prostate: Secondary | ICD-10-CM | POA: Diagnosis not present

## 2023-06-12 DIAGNOSIS — I130 Hypertensive heart and chronic kidney disease with heart failure and stage 1 through stage 4 chronic kidney disease, or unspecified chronic kidney disease: Secondary | ICD-10-CM | POA: Diagnosis not present

## 2023-06-12 DIAGNOSIS — E1122 Type 2 diabetes mellitus with diabetic chronic kidney disease: Secondary | ICD-10-CM | POA: Diagnosis not present

## 2023-06-12 DIAGNOSIS — E039 Hypothyroidism, unspecified: Secondary | ICD-10-CM | POA: Diagnosis not present

## 2023-06-12 DIAGNOSIS — E538 Deficiency of other specified B group vitamins: Secondary | ICD-10-CM | POA: Diagnosis not present

## 2023-06-22 DIAGNOSIS — H524 Presbyopia: Secondary | ICD-10-CM | POA: Diagnosis not present

## 2023-06-25 DIAGNOSIS — D485 Neoplasm of uncertain behavior of skin: Secondary | ICD-10-CM | POA: Diagnosis not present

## 2023-06-25 DIAGNOSIS — C441122 Basal cell carcinoma of skin of right lower eyelid, including canthus: Secondary | ICD-10-CM | POA: Diagnosis not present

## 2023-07-28 DIAGNOSIS — E039 Hypothyroidism, unspecified: Secondary | ICD-10-CM | POA: Diagnosis not present

## 2023-09-08 DIAGNOSIS — E039 Hypothyroidism, unspecified: Secondary | ICD-10-CM | POA: Diagnosis not present

## 2023-09-21 DIAGNOSIS — H903 Sensorineural hearing loss, bilateral: Secondary | ICD-10-CM | POA: Diagnosis not present

## 2023-09-21 DIAGNOSIS — H6123 Impacted cerumen, bilateral: Secondary | ICD-10-CM | POA: Diagnosis not present

## 2023-12-11 DIAGNOSIS — Z09 Encounter for follow-up examination after completed treatment for conditions other than malignant neoplasm: Secondary | ICD-10-CM | POA: Diagnosis not present

## 2023-12-11 DIAGNOSIS — E039 Hypothyroidism, unspecified: Secondary | ICD-10-CM | POA: Diagnosis not present

## 2023-12-11 DIAGNOSIS — E538 Deficiency of other specified B group vitamins: Secondary | ICD-10-CM | POA: Diagnosis not present

## 2023-12-11 DIAGNOSIS — I130 Hypertensive heart and chronic kidney disease with heart failure and stage 1 through stage 4 chronic kidney disease, or unspecified chronic kidney disease: Secondary | ICD-10-CM | POA: Diagnosis not present

## 2023-12-11 DIAGNOSIS — N1831 Chronic kidney disease, stage 3a: Secondary | ICD-10-CM | POA: Diagnosis not present

## 2023-12-11 DIAGNOSIS — I509 Heart failure, unspecified: Secondary | ICD-10-CM | POA: Diagnosis not present

## 2023-12-11 DIAGNOSIS — E1159 Type 2 diabetes mellitus with other circulatory complications: Secondary | ICD-10-CM | POA: Diagnosis not present

## 2023-12-11 DIAGNOSIS — F418 Other specified anxiety disorders: Secondary | ICD-10-CM | POA: Diagnosis not present

## 2023-12-11 DIAGNOSIS — E1122 Type 2 diabetes mellitus with diabetic chronic kidney disease: Secondary | ICD-10-CM | POA: Diagnosis not present

## 2023-12-11 DIAGNOSIS — I152 Hypertension secondary to endocrine disorders: Secondary | ICD-10-CM | POA: Diagnosis not present

## 2023-12-11 DIAGNOSIS — E559 Vitamin D deficiency, unspecified: Secondary | ICD-10-CM | POA: Diagnosis not present

## 2024-03-21 DIAGNOSIS — H903 Sensorineural hearing loss, bilateral: Secondary | ICD-10-CM | POA: Diagnosis not present

## 2024-03-21 DIAGNOSIS — H6123 Impacted cerumen, bilateral: Secondary | ICD-10-CM | POA: Diagnosis not present

## 2024-05-30 DIAGNOSIS — H903 Sensorineural hearing loss, bilateral: Secondary | ICD-10-CM | POA: Diagnosis not present

## 2024-05-30 DIAGNOSIS — H6123 Impacted cerumen, bilateral: Secondary | ICD-10-CM | POA: Diagnosis not present

## 2024-05-30 DIAGNOSIS — C44311 Basal cell carcinoma of skin of nose: Secondary | ICD-10-CM | POA: Diagnosis not present

## 2024-05-30 DIAGNOSIS — H90A22 Sensorineural hearing loss, unilateral, left ear, with restricted hearing on the contralateral side: Secondary | ICD-10-CM | POA: Diagnosis not present

## 2024-05-31 ENCOUNTER — Encounter: Payer: Self-pay | Admitting: Otolaryngology

## 2024-06-01 NOTE — Anesthesia Preprocedure Evaluation (Signed)
 Anesthesia Evaluation    Airway        Dental   Pulmonary former smoker          Cardiovascular hypertension,      Neuro/Psych    GI/Hepatic   Endo/Other  diabetes    Renal/GU      Musculoskeletal   Abdominal   Peds  Hematology   Anesthesia Other Findings Patient doesn't want anything except local anesthesia, so anesthesia department will not be involved in his care.  Reproductive/Obstetrics                              Anesthesia Physical Anesthesia Plan Anesthesia Quick Evaluation

## 2024-06-02 ENCOUNTER — Encounter: Payer: Self-pay | Admitting: Otolaryngology

## 2024-06-02 ENCOUNTER — Encounter: Payer: Self-pay | Admitting: Anesthesiology

## 2024-06-02 ENCOUNTER — Encounter
Admission: RE | Disposition: A | Discharge status: Home / Self Care | Payer: Self-pay | Source: Home / Self Care | Attending: Otolaryngology

## 2024-06-02 ENCOUNTER — Other Ambulatory Visit: Payer: Self-pay | Admitting: Otolaryngology

## 2024-06-02 ENCOUNTER — Other Ambulatory Visit: Payer: Self-pay

## 2024-06-02 ENCOUNTER — Ambulatory Visit
Admission: RE | Admit: 2024-06-02 | Discharge: 2024-06-02 | Disposition: A | Discharge status: Home / Self Care | Attending: Otolaryngology | Admitting: Otolaryngology

## 2024-06-02 DIAGNOSIS — Z87891 Personal history of nicotine dependence: Secondary | ICD-10-CM | POA: Diagnosis not present

## 2024-06-02 DIAGNOSIS — Z7982 Long term (current) use of aspirin: Secondary | ICD-10-CM | POA: Diagnosis not present

## 2024-06-02 DIAGNOSIS — C44311 Basal cell carcinoma of skin of nose: Secondary | ICD-10-CM | POA: Diagnosis not present

## 2024-06-02 DIAGNOSIS — Z79899 Other long term (current) drug therapy: Secondary | ICD-10-CM | POA: Insufficient documentation

## 2024-06-02 HISTORY — PX: EXCISION MASS HEAD: SHX6702

## 2024-06-02 HISTORY — DX: Anxiety disorder, unspecified: F41.9

## 2024-06-02 HISTORY — DX: Malignant (primary) neoplasm, unspecified: C80.1

## 2024-06-02 HISTORY — DX: Thyrotoxicosis, unspecified without thyrotoxic crisis or storm: E05.90

## 2024-06-02 HISTORY — DX: Type 2 diabetes mellitus without complications: E11.9

## 2024-06-02 HISTORY — DX: Personal history of urinary calculi: Z87.442

## 2024-06-02 SURGERY — EXCISION, MASS, HEAD
Anesthesia: LOCAL | Site: Nose | Laterality: Right

## 2024-06-02 MED ORDER — LIDOCAINE-EPINEPHRINE 1 %-1:100000 IJ SOLN
INTRAMUSCULAR | Status: DC | PRN
  Administered 2024-06-02: 4 mL

## 2024-06-02 MED ORDER — BACITRACIN 500 UNIT/GM EX OINT
TOPICAL_OINTMENT | CUTANEOUS | Status: DC | PRN
  Administered 2024-06-02: 1 via TOPICAL

## 2024-06-02 MED ORDER — HYDROCODONE-ACETAMINOPHEN 5-325 MG PO TABS: 1.0000 | ORAL_TABLET | ORAL | 0 refills | Status: AC | PRN

## 2024-06-02 SURGICAL SUPPLY — 25 items
BLADE SURG 15 STRL LF DISP TIS (BLADE) IMPLANT
CORD BIP STRL DISP 12FT (MISCELLANEOUS) IMPLANT
COTTONBALL LRG STERILE PKG (GAUZE/BANDAGES/DRESSINGS) IMPLANT
DRSG EMULSION OIL 3X3 NADH (GAUZE/BANDAGES/DRESSINGS) IMPLANT
DRSG TEGADERM 2-3/8X2-3/4 SM (GAUZE/BANDAGES/DRESSINGS) IMPLANT
DRSG TELFA 4X3 1S NADH ST (GAUZE/BANDAGES/DRESSINGS) IMPLANT
ELECTRODE CAUTERY BLDE TIP 2.5 (TIP) IMPLANT
ELECTRODE REM PT RTRN 9FT ADLT (ELECTROSURGICAL) ×1 IMPLANT
GAUZE SPONGE 4X4 12PLY STRL (GAUZE/BANDAGES/DRESSINGS) IMPLANT
GLOVE SURG GAMMEX PI TX LF 7.5 (GLOVE) ×2 IMPLANT
GOWN STRL REUS W/ TWL LRG LVL3 (GOWN DISPOSABLE) IMPLANT
KIT TURNOVER KIT A (KITS) ×1 IMPLANT
NDL HYPO 25GX1X1/2 BEV (NEEDLE) ×1 IMPLANT
NEEDLE HYPO 25GX1X1/2 BEV (NEEDLE) ×1 IMPLANT
NS IRRIG 500ML POUR BTL (IV SOLUTION) ×1 IMPLANT
PACK ENT CUSTOM (PACKS) ×1 IMPLANT
PENCIL SMOKE EVACUATOR (MISCELLANEOUS) ×1 IMPLANT
SOLUTION PREP PVP 2OZ (MISCELLANEOUS) ×1 IMPLANT
SPONGE KITTNER 5P (MISCELLANEOUS) ×1 IMPLANT
STRAP BODY AND KNEE 60X3 (MISCELLANEOUS) ×1 IMPLANT
SUCTION TUBE FRAZIER 10FR DISP (SUCTIONS) IMPLANT
SUT PROLENE 5 0 P 3 (SUTURE) ×1 IMPLANT
SUT PROLENE 6 0 P 1 18 (SUTURE) IMPLANT
SUT VIC AB 4-0 RB1 27X BRD (SUTURE) ×1 IMPLANT
SYR 10ML LL (SYRINGE) ×1 IMPLANT

## 2024-06-02 NOTE — H&P (Signed)
H&P has been reviewed and patient reevaluated, no changes necessary. To be downloaded later.  

## 2024-06-02 NOTE — Progress Notes (Signed)
 Patient refused anesthesia care and meds, so no anesthesia care for this patient; surgeon only.

## 2024-06-02 NOTE — OR Nursing (Signed)
 Patient does not want anesthesia for his surgery.  Patient on room air. Vital signs during surgery.  0931 P: 72 O2: 90% BP: 129/69  0935 (Case Start) P: 62 O2:91% BP: 130/64  0940 P: 67 O2: 90% BP: 131/77  0945 P: 66 O2: 90% BP: 127/69  0950 P: 64 O2: 91% BP: 120/65  0955 P: 60 O2: 94% BP: 118/62  1000 P: 61 O2: 92% BP: 124/64  1005 P: 61 O2: 90% BP: 122/64  1010 P: 71 O2: 90% BP: 126/64

## 2024-06-02 NOTE — Op Note (Signed)
 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  Post-op Dx: Same  Proc: Excision skin lesion right nasal ala with full-thickness skin removed with a 2 mm margin total size was 14 mm x 12 mm in diameter  Surg:  Lonnie Clarke  Anes: Local  EBL: 20 mL  Comp: None  Findings: The lesion was a round lesion that was 9 mm in size with a central depression.  There were 2 smaller skin lesions just inferior to it closer to the alar margin that were included in the excision because they look like they could be very early basal cell cancers starting there.  The margin was marked out first before local was placed.  Once the mass was removed a long purple Vicryl stitch was placed on the medial border of the excision.  A short looped purple Vicryl was placed at the superior border.  A light blue Prolene suture was placed at the inferior lateral margin.  Procedure: Patient was brought to the operating room and placed in a supine position.  His nose was prepped with an alcohol  wash and including part of his cheek and face.  He was prepped and draped in sterile fashion.  1% Xylocaine  with epi 1:100,000 was used for infiltration all around the skin lesion.  Approximately 5 mL was used in the entire area.  This was allowed to sit before 10 minutes for full vasoconstriction and anesthesia.  Checked to see if he could feel anything and he could not.  An incision was then created around the marking which was a 2 mm margin around the basal cell at the superior, anterior, and lateral area.  Inferiorly there were 2 small bumps that were separate but just beneath the main skin lesion.  These were included in the excision extending more inferiorly towards the inferior margin of the nasal ala.  Once the incision line was created all the way around it then sharp scissors were used for dissecting down and beneath this.  Full-thickness skin and  some of the subcu tissue was removed.  The nasal alar cartilage was left intact with perichondrium still on it.  The lesion was completely removed and it was then marked as noted above for orientation.  This was sent for permanent section.  The wound was visualized and small areas of bleeding were controlled with bipolar cautery.  Once the wound was nice and dry some Surgicel was placed down into the wound to help control bleeding.  This was then covered with Vaseline gauze and then some cotton to fill in the defect and create pressure here.  Telfa was placed over the top of this followed by Tegaderm and tape over the bridge of the nose.  Patient tolerated the procedure well.  He was taken to the recovery room in satisfactory condition.  There were no operative complications.   Dispo:   To PACU to be discharged home  Plan: Follow-up in the office in 5 days to make sure the pathology report has clear margins and plan for reconstruction in 1 week from today.  I have given him some Tylenol  with hydrocodone  to use 1 pill every 4 hours as needed for pain.  He can use Tylenol  instead if he wishes.  Lonnie Clarke  06/02/2024 10:17 AM

## 2024-06-02 NOTE — Progress Notes (Signed)
 Patient does not wish to have anything except local anesthesia administered by surgeon.

## 2024-06-06 LAB — SURGICAL PATHOLOGY

## 2024-06-07 ENCOUNTER — Encounter: Payer: Self-pay | Admitting: Otolaryngology

## 2024-06-07 DIAGNOSIS — H5213 Myopia, bilateral: Secondary | ICD-10-CM | POA: Diagnosis not present

## 2024-06-07 DIAGNOSIS — H524 Presbyopia: Secondary | ICD-10-CM | POA: Diagnosis not present

## 2024-06-09 ENCOUNTER — Ambulatory Visit: Payer: Self-pay | Admitting: General Practice

## 2024-06-09 ENCOUNTER — Encounter
Admission: RE | Disposition: A | Discharge status: Home / Self Care | Payer: Self-pay | Source: Home / Self Care | Attending: Otolaryngology

## 2024-06-09 ENCOUNTER — Encounter: Payer: Self-pay | Admitting: General Practice

## 2024-06-09 ENCOUNTER — Other Ambulatory Visit: Payer: Self-pay

## 2024-06-09 ENCOUNTER — Encounter: Payer: Self-pay | Admitting: Otolaryngology

## 2024-06-09 ENCOUNTER — Ambulatory Visit
Admission: RE | Admit: 2024-06-09 | Discharge: 2024-06-09 | Disposition: A | Discharge status: Home / Self Care | Attending: Otolaryngology | Admitting: Otolaryngology

## 2024-06-09 DIAGNOSIS — Z85828 Personal history of other malignant neoplasm of skin: Secondary | ICD-10-CM | POA: Insufficient documentation

## 2024-06-09 DIAGNOSIS — Z8673 Personal history of transient ischemic attack (TIA), and cerebral infarction without residual deficits: Secondary | ICD-10-CM | POA: Insufficient documentation

## 2024-06-09 DIAGNOSIS — Z87891 Personal history of nicotine dependence: Secondary | ICD-10-CM | POA: Insufficient documentation

## 2024-06-09 DIAGNOSIS — Z7982 Long term (current) use of aspirin: Secondary | ICD-10-CM | POA: Insufficient documentation

## 2024-06-09 DIAGNOSIS — H903 Sensorineural hearing loss, bilateral: Secondary | ICD-10-CM | POA: Insufficient documentation

## 2024-06-09 DIAGNOSIS — E1151 Type 2 diabetes mellitus with diabetic peripheral angiopathy without gangrene: Secondary | ICD-10-CM | POA: Diagnosis not present

## 2024-06-09 DIAGNOSIS — M950 Acquired deformity of nose: Secondary | ICD-10-CM | POA: Insufficient documentation

## 2024-06-09 DIAGNOSIS — E0590 Thyrotoxicosis, unspecified without thyrotoxic crisis or storm: Secondary | ICD-10-CM | POA: Diagnosis not present

## 2024-06-09 DIAGNOSIS — C44311 Basal cell carcinoma of skin of nose: Secondary | ICD-10-CM | POA: Diagnosis not present

## 2024-06-09 DIAGNOSIS — I10 Essential (primary) hypertension: Secondary | ICD-10-CM | POA: Insufficient documentation

## 2024-06-09 DIAGNOSIS — H6123 Impacted cerumen, bilateral: Secondary | ICD-10-CM | POA: Insufficient documentation

## 2024-06-09 DIAGNOSIS — F419 Anxiety disorder, unspecified: Secondary | ICD-10-CM | POA: Insufficient documentation

## 2024-06-09 HISTORY — PX: NASAL FLAP ROTATION: SHX5366

## 2024-06-09 SURGERY — CREATION, FLAP, PEDICLED ROTATION, FOREHEAD AND NOSE
Anesthesia: General | Laterality: Right

## 2024-06-09 MED ORDER — LACTATED RINGERS IV SOLN: INTRAVENOUS | Status: DC

## 2024-06-09 MED ORDER — LIDOCAINE-EPINEPHRINE 1 %-1:100000 IJ SOLN
INTRAMUSCULAR | Status: DC | PRN
Start: 2024-06-09 — End: 2024-06-09
  Administered 2024-06-09: 4 mL

## 2024-06-09 MED ORDER — OXYCODONE HCL 5 MG PO TABS: 5.0000 mg | ORAL_TABLET | Freq: Once | ORAL | Status: DC | PRN

## 2024-06-09 MED ORDER — DEXTROSE 5 % IV SOLN
1000.0000 mg | Freq: Once | INTRAVENOUS | Status: AC
  Administered 2024-06-09: 1000 mg via INTRAVENOUS

## 2024-06-09 MED ORDER — FENTANYL CITRATE (PF) 100 MCG/2ML IJ SOLN
INTRAMUSCULAR | Status: AC
Start: 2024-06-09 — End: 2024-06-09
  Filled 2024-06-09: qty 2

## 2024-06-09 MED ORDER — FENTANYL CITRATE (PF) 100 MCG/2ML IJ SOLN
INTRAMUSCULAR | Status: DC | PRN
  Administered 2024-06-09 (×2): 50 ug via INTRAVENOUS

## 2024-06-09 MED ORDER — LIDOCAINE HCL (PF) 2 % IJ SOLN
INTRAMUSCULAR | Status: AC
  Filled 2024-06-09: qty 5

## 2024-06-09 MED ORDER — CEFAZOLIN SODIUM 1 G IJ SOLR
INTRAMUSCULAR | Status: AC
  Filled 2024-06-09: qty 10

## 2024-06-09 MED ORDER — FENTANYL CITRATE PF 50 MCG/ML IJ SOSY: 25.0000 ug | PREFILLED_SYRINGE | INTRAMUSCULAR | Status: DC | PRN

## 2024-06-09 MED ORDER — SODIUM CHLORIDE 0.9 % IR SOLN
Status: DC | PRN
Start: 2024-06-09 — End: 2024-06-09
  Administered 2024-06-09: 500 mL

## 2024-06-09 MED ORDER — LIDOCAINE HCL (CARDIAC) PF 100 MG/5ML IV SOSY
PREFILLED_SYRINGE | INTRAVENOUS | Status: DC | PRN
  Administered 2024-06-09: 150 mg via INTRATRACHEAL
  Administered 2024-06-09: 60 mg via INTRATRACHEAL

## 2024-06-09 MED ORDER — BACITRACIN 500 UNIT/GM EX OINT
TOPICAL_OINTMENT | CUTANEOUS | Status: DC | PRN
  Administered 2024-06-09: 1 via TOPICAL

## 2024-06-09 MED ORDER — PROPOFOL 10 MG/ML IV BOLUS
INTRAVENOUS | Status: DC | PRN
  Administered 2024-06-09: 150 mg via INTRAVENOUS

## 2024-06-09 MED ORDER — OXYCODONE HCL 5 MG/5ML PO SOLN: 5.0000 mg | Freq: Once | ORAL | Status: DC | PRN

## 2024-06-09 MED ORDER — CEPHALEXIN 500 MG PO CAPS: 500.0000 mg | ORAL_CAPSULE | Freq: Three times a day (TID) | ORAL | 0 refills | Status: AC

## 2024-06-09 MED ORDER — PROPOFOL 10 MG/ML IV BOLUS
INTRAVENOUS | Status: AC
  Filled 2024-06-09: qty 20

## 2024-06-09 SURGICAL SUPPLY — 22 items
CORD BIP STRL DISP 12FT (MISCELLANEOUS) IMPLANT
DRAPE HEAD BAR (DRAPES) ×1 IMPLANT
DRSG TEGADERM 2-3/8X2-3/4 SM (GAUZE/BANDAGES/DRESSINGS) IMPLANT
DRSG TELFA 4X3 1S NADH ST (GAUZE/BANDAGES/DRESSINGS) IMPLANT
GAUZE SPONGE 4X4 12PLY STRL (GAUZE/BANDAGES/DRESSINGS) IMPLANT
GLOVE SURG GAMMEX PI TX LF 7.5 (GLOVE) ×2 IMPLANT
GOWN STRL REUS W/ TWL LRG LVL3 (GOWN DISPOSABLE) ×1 IMPLANT
KIT TURNOVER KIT A (KITS) ×1 IMPLANT
NDL HYPO 25GX1X1/2 BEV (NEEDLE) ×1 IMPLANT
NDL PRECISIONGLIDE 27X1.5 (NEEDLE) IMPLANT
NEEDLE HYPO 25GX1X1/2 BEV (NEEDLE) ×1 IMPLANT
NEEDLE PRECISIONGLIDE 27X1.5 (NEEDLE) ×1 IMPLANT
NS IRRIG 500ML POUR BTL (IV SOLUTION) ×1 IMPLANT
PACK ENT CUSTOM (PACKS) ×1 IMPLANT
SOLUTION PREP PVP 2OZ (MISCELLANEOUS) ×1 IMPLANT
STRAP BODY AND KNEE 60X3 (MISCELLANEOUS) ×1 IMPLANT
SUT PROLENE 5 0 P 3 (SUTURE) IMPLANT
SUT PROLENE 5 0 PS 3 (SUTURE) IMPLANT
SUT VIC AB 5-0 P-3 18X BRD (SUTURE) IMPLANT
SUT VICRYL 6-0 S14 CTD (SUTURE) IMPLANT
SYR 10ML LL (SYRINGE) ×1 IMPLANT
SYR 3ML LL SCALE MARK (SYRINGE) IMPLANT

## 2024-06-09 NOTE — Anesthesia Postprocedure Evaluation (Signed)
 Anesthesia Post Note  Patient: Lonnie Clarke  Procedure(s) Performed: CREATION, FLAP, PEDICLED ROTATION, FOREHEAD AND NOSE (Right)  Patient location during evaluation: PACU Anesthesia Type: General Level of consciousness: awake and alert Pain management: pain level controlled Vital Signs Assessment: post-procedure vital signs reviewed and stable Respiratory status: spontaneous breathing, nonlabored ventilation, respiratory function stable and patient connected to nasal cannula oxygen Cardiovascular status: blood pressure returned to baseline and stable Postop Assessment: no apparent nausea or vomiting Anesthetic complications: no   No notable events documented.   Last Vitals:  Vitals:   06/09/24 0718 06/09/24 0921  BP: 135/78 124/61  Pulse:  61  Resp: 17 11  Temp: 36.7 C (!) 36.3 C  SpO2: 93% 90%    Last Pain:  Vitals:   06/09/24 0921  TempSrc:   PainSc: Asleep                 Debby Mines

## 2024-06-09 NOTE — Anesthesia Preprocedure Evaluation (Addendum)
 Anesthesia Evaluation  Patient identified by MRN, date of birth, ID band Patient awake    Reviewed: Allergy & Precautions, NPO status , Patient's Chart, lab work & pertinent test results  Airway Mallampati: III  TM Distance: >3 FB Neck ROM: full    Dental  (+) Chipped   Pulmonary former smoker   Pulmonary exam normal        Cardiovascular hypertension, + Peripheral Vascular Disease  Normal cardiovascular exam     Neuro/Psych  PSYCHIATRIC DISORDERS Anxiety     CVA, No Residual Symptoms    GI/Hepatic negative GI ROS, Neg liver ROS,,,  Endo/Other  diabetes, Well Controlled Hyperthyroidism   Renal/GU      Musculoskeletal   Abdominal   Peds  Hematology negative hematology ROS (+)   Anesthesia Other Findings Past Medical History: No date: Anxiety No date: Cancer [facility])     Comment:  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 No date: Diabetes mellitus without complication (HCC)     Comment:  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 No date: History of kidney stones No date: Hyperlipidemia No date: Hypertension No date: Hyperthyroidism  Past Surgical History: 10/31/2022: EXCISION MASS HEAD; Left     Comment:  Procedure: EXCISION AURICULAR MASS;  Surgeon: Edda Mt, MD;  Location: [facility];  Service: ENT;               Laterality: Left; 06/02/2024: EXCISION MASS HEAD; Right     Comment:  Procedure: EXCISION, MASS, HEAD;  Surgeon: Edda Mt, MD;  Location: [facility];  Service: ENT;               Laterality: Right;  exc basal cell carcinoma right nasal               ala (1 cm) 10/14/2011: HERNIA REPAIR 05/08/2020: HIP ARTHROPLASTY; Left     Comment:  Procedure: ARTHROPLASTY BIPOLAR HIP (HEMIARTHROPLASTY);               Surgeon: Kathlynn Sharper, MD;  Location: [facility] ORS;                Service: Orthopedics;  Laterality: Left; No date: MANDIBLE FRACTURE SURGERY; N/A  BMI    Body Mass  Index: 26.66 kg/m      Reproductive/Obstetrics negative OB ROS                              Anesthesia Physical Anesthesia Plan  ASA: 2  Anesthesia Plan: General ETT   Post-op Pain Management: Dilaudid IV   Induction: Intravenous  PONV Risk Score and Plan: 2 and Ondansetron  and Dexamethasone   Airway Management Planned: LMA  Additional Equipment:   Intra-op Plan:   Post-operative Plan: Extubation in OR  Informed Consent: I have reviewed the patients History and Physical, chart, labs and discussed the procedure including the risks, benefits and alternatives for the proposed anesthesia with the patient or authorized representative who has indicated his/her understanding and acceptance.     Dental Advisory Given  Plan Discussed with: Anesthesiologist, CRNA and Surgeon  Anesthesia Plan Comments: (Patient consented for risks of anesthesia including but not limited to:  - adverse reactions to medications - damage to eyes, teeth, lips or other oral mucosa - nerve damage due to positioning  -  sore throat or hoarseness - Damage to heart, brain, nerves, lungs, other parts of body or loss of life  Patient voiced understanding and assent.)         Anesthesia Quick Evaluation

## 2024-06-09 NOTE — H&P (Signed)
H&P has been reviewed and patient reevaluated, no changes necessary. To be downloaded later.  

## 2024-06-09 NOTE — Anesthesia Procedure Notes (Signed)
 Procedure Name: LMA Insertion Date/Time: 06/09/2024 8:12 AM  Performed by: Levy Harvey, CRNAPre-anesthesia Checklist: Patient identified, Patient being monitored, Timeout performed, Emergency Drugs available and Suction available Patient Re-evaluated:Patient Re-evaluated prior to induction Oxygen Delivery Method: Circle system utilized Preoxygenation: Pre-oxygenation with 100% oxygen Induction Type: IV induction Ventilation: Mask ventilation without difficulty LMA: LMA inserted LMA Size: 5.0 Tube type: Oral Number of attempts: 1 Placement Confirmation: positive ETCO2 and breath sounds checked- equal and bilateral Tube secured with: Tape Dental Injury: Teeth and Oropharynx as per pre-operative assessment

## 2024-06-09 NOTE — Transfer of Care (Signed)
 Immediate Anesthesia Transfer of Care Note  Patient: Lonnie Clarke  Procedure(s) Performed: CREATION, FLAP, PEDICLED ROTATION, FOREHEAD AND NOSE (Right)  Patient Location: PACU  Anesthesia Type: General ETT  Level of Consciousness: awake, alert  and patient cooperative  Airway and Oxygen Therapy: Patient Spontanous Breathing and Patient connected to supplemental oxygen  Post-op Assessment: Post-op Vital signs reviewed, Patient's Cardiovascular Status Stable, Respiratory Function Stable, Patent Airway and No signs of Nausea or vomiting  Post-op Vital Signs: Reviewed and stable  Complications: No notable events documented.

## 2024-06-09 NOTE — Op Note (Signed)
 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  The flap was 4.5 cm long and 1.7 cm in width  Surg:  Lonnie Clarke  Anes:  GOT  EBL: 25 mL  Comp: None  Findings: The wound was clean and there was granulation tissue at the bed.  The defect hole was 1.9 cm at its longest space and 1.7 cm in width.  Full-thickness skin has been removed.  Procedure: Patient was brought to the operating room placed in a supine position.  He was given general anesthesia by LMA.  The right cheek was visualized area was prepped and draped in sterile fashion.  Area was was marked for the flap into his right cheek based superiorly.  The patient had a scar on his right cheek from a previous skin lesion excision many years ago.  The flap was drawn out with a slight extension inferiorly to allow for better closure.  This was to be transposed medially onto the right nasal alar defect.  Once this was drawn out and measured to make sure the flap was long enough the area was infiltrated with 4 mL of 1% Xylocaine  with epi 1-100,000.  The incisions were created around the flap as it was previously drawn.  The flap was then raised from inferior to superior.  This was done in the fat plane just below the dermis to keep the dermis intact with its blood supply.  Care was taken not to go deep into the fat layer so that the blood vessels were left intact here.  The flap was fully elevated and then the skin was elevated going the lateral onto the cheek to allow the cheek skin to elevate and advance to help close the wound.  With the flap elevated the defect had some of the superficial granulation removed to make sure there is no squamous cells migrating into the area.  The flap was placed into position and a 5-0  Vicryl and 6-0 Vicryl were used for tagging the dermis on its edges into the defect hole.  The 5-0 Vicryl was then used to advance the cheek skin to close the donor space that was created when the flap was transposed.  Once the's was pulled together then 5-0 Prolene was used to close the skin edges of the donor portion as well as where the transposed flap was placed into the nasal alar defect.  There is slight upward pull on the right nasal ala that should relax as the skin slowly stretches.  The flap looks healthy and had good vascular return with palpation.  The wound was then covered with small amount of bacitracin  ointment followed by Telfa and a Tegaderm dressing.  The patient was awakened and taken to recovery room in satisfactory condition.  There were no operative complications.  Dispo:   To PACU to be discharged home.  He is to rest at home.  Plan: He can increase his diet as tolerated.  He will keep his head elevated for a day or so.  He will keep a bandage over this area but can change that as needed.  He has pain medication at home he can use if this is significant, or just use Tylenol  or ibuprofen as needed.  I have  written for some antibiotics to use preventatively.  He can restart his baby aspirin daily again.  He will follow-up in the office in 5 days for wound evaluation  Lonnie Clarke  06/09/2024 9:17 AM

## 2024-06-10 DIAGNOSIS — E1122 Type 2 diabetes mellitus with diabetic chronic kidney disease: Secondary | ICD-10-CM | POA: Diagnosis not present

## 2024-06-10 DIAGNOSIS — Z0000 Encounter for general adult medical examination without abnormal findings: Secondary | ICD-10-CM | POA: Diagnosis not present

## 2024-06-10 DIAGNOSIS — E7800 Pure hypercholesterolemia, unspecified: Secondary | ICD-10-CM | POA: Diagnosis not present

## 2024-06-10 DIAGNOSIS — F418 Other specified anxiety disorders: Secondary | ICD-10-CM | POA: Diagnosis not present

## 2024-06-10 DIAGNOSIS — E559 Vitamin D deficiency, unspecified: Secondary | ICD-10-CM | POA: Diagnosis not present

## 2024-06-10 DIAGNOSIS — I152 Hypertension secondary to endocrine disorders: Secondary | ICD-10-CM | POA: Diagnosis not present

## 2024-06-10 DIAGNOSIS — E1159 Type 2 diabetes mellitus with other circulatory complications: Secondary | ICD-10-CM | POA: Diagnosis not present

## 2024-06-10 DIAGNOSIS — Z8673 Personal history of transient ischemic attack (TIA), and cerebral infarction without residual deficits: Secondary | ICD-10-CM | POA: Diagnosis not present

## 2024-06-10 DIAGNOSIS — Z79899 Other long term (current) drug therapy: Secondary | ICD-10-CM | POA: Diagnosis not present

## 2024-06-10 DIAGNOSIS — E039 Hypothyroidism, unspecified: Secondary | ICD-10-CM | POA: Diagnosis not present

## 2024-06-10 DIAGNOSIS — N1831 Chronic kidney disease, stage 3a: Secondary | ICD-10-CM | POA: Diagnosis not present

## 2024-06-10 DIAGNOSIS — I130 Hypertensive heart and chronic kidney disease with heart failure and stage 1 through stage 4 chronic kidney disease, or unspecified chronic kidney disease: Secondary | ICD-10-CM | POA: Diagnosis not present

## 2024-06-10 DIAGNOSIS — E538 Deficiency of other specified B group vitamins: Secondary | ICD-10-CM | POA: Diagnosis not present

## 2024-06-28 DIAGNOSIS — H524 Presbyopia: Secondary | ICD-10-CM | POA: Diagnosis not present

## 2024-07-08 DIAGNOSIS — I152 Hypertension secondary to endocrine disorders: Secondary | ICD-10-CM | POA: Diagnosis not present

## 2024-07-08 DIAGNOSIS — E7800 Pure hypercholesterolemia, unspecified: Secondary | ICD-10-CM | POA: Diagnosis not present

## 2024-07-08 DIAGNOSIS — I130 Hypertensive heart and chronic kidney disease with heart failure and stage 1 through stage 4 chronic kidney disease, or unspecified chronic kidney disease: Secondary | ICD-10-CM | POA: Diagnosis not present

## 2024-07-08 DIAGNOSIS — E1159 Type 2 diabetes mellitus with other circulatory complications: Secondary | ICD-10-CM | POA: Diagnosis not present

## 2024-07-08 DIAGNOSIS — N1831 Chronic kidney disease, stage 3a: Secondary | ICD-10-CM | POA: Diagnosis not present

## 2024-07-08 DIAGNOSIS — F418 Other specified anxiety disorders: Secondary | ICD-10-CM | POA: Diagnosis not present

## 2024-07-08 DIAGNOSIS — E1122 Type 2 diabetes mellitus with diabetic chronic kidney disease: Secondary | ICD-10-CM | POA: Diagnosis not present

## 2024-07-14 DIAGNOSIS — H524 Presbyopia: Secondary | ICD-10-CM | POA: Diagnosis not present

## 2024-09-06 DIAGNOSIS — H2513 Age-related nuclear cataract, bilateral: Secondary | ICD-10-CM | POA: Diagnosis not present

## 2024-09-06 DIAGNOSIS — H2511 Age-related nuclear cataract, right eye: Secondary | ICD-10-CM | POA: Diagnosis not present

## 2024-09-12 ENCOUNTER — Encounter: Payer: Self-pay | Admitting: Ophthalmology

## 2024-09-13 DIAGNOSIS — H6123 Impacted cerumen, bilateral: Secondary | ICD-10-CM | POA: Diagnosis not present

## 2024-09-13 DIAGNOSIS — H903 Sensorineural hearing loss, bilateral: Secondary | ICD-10-CM | POA: Diagnosis not present

## 2024-09-13 NOTE — Discharge Instructions (Signed)

## 2024-09-19 ENCOUNTER — Ambulatory Visit
Admission: RE | Admit: 2024-09-19 | Discharge: 2024-09-19 | Disposition: A | Attending: Ophthalmology | Admitting: Ophthalmology

## 2024-09-19 ENCOUNTER — Ambulatory Visit: Payer: Self-pay | Admitting: Anesthesiology

## 2024-09-19 ENCOUNTER — Other Ambulatory Visit: Payer: Self-pay

## 2024-09-19 ENCOUNTER — Encounter: Payer: Self-pay | Admitting: Ophthalmology

## 2024-09-19 ENCOUNTER — Encounter: Admission: RE | Disposition: A | Payer: Self-pay | Source: Home / Self Care | Attending: Ophthalmology

## 2024-09-19 DIAGNOSIS — Z87891 Personal history of nicotine dependence: Secondary | ICD-10-CM | POA: Diagnosis not present

## 2024-09-19 DIAGNOSIS — H2511 Age-related nuclear cataract, right eye: Secondary | ICD-10-CM | POA: Diagnosis not present

## 2024-09-19 DIAGNOSIS — Z8673 Personal history of transient ischemic attack (TIA), and cerebral infarction without residual deficits: Secondary | ICD-10-CM | POA: Diagnosis not present

## 2024-09-19 DIAGNOSIS — Z7984 Long term (current) use of oral hypoglycemic drugs: Secondary | ICD-10-CM | POA: Diagnosis not present

## 2024-09-19 DIAGNOSIS — Z604 Social exclusion and rejection: Secondary | ICD-10-CM | POA: Diagnosis not present

## 2024-09-19 DIAGNOSIS — E039 Hypothyroidism, unspecified: Secondary | ICD-10-CM | POA: Diagnosis not present

## 2024-09-19 DIAGNOSIS — F418 Other specified anxiety disorders: Secondary | ICD-10-CM | POA: Diagnosis not present

## 2024-09-19 DIAGNOSIS — I10 Essential (primary) hypertension: Secondary | ICD-10-CM | POA: Diagnosis not present

## 2024-09-19 DIAGNOSIS — H25011 Cortical age-related cataract, right eye: Secondary | ICD-10-CM | POA: Diagnosis not present

## 2024-09-19 DIAGNOSIS — E1136 Type 2 diabetes mellitus with diabetic cataract: Secondary | ICD-10-CM | POA: Diagnosis not present

## 2024-09-19 DIAGNOSIS — E1151 Type 2 diabetes mellitus with diabetic peripheral angiopathy without gangrene: Secondary | ICD-10-CM | POA: Diagnosis not present

## 2024-09-19 HISTORY — PX: CATARACT EXTRACTION W/PHACO: SHX586

## 2024-09-19 HISTORY — DX: Depression, unspecified: F32.A

## 2024-09-19 HISTORY — DX: Sensorineural hearing loss, bilateral: H90.3

## 2024-09-19 HISTORY — DX: Type 2 diabetes mellitus without complications: E11.9

## 2024-09-19 HISTORY — DX: Hypothyroidism, unspecified: E03.9

## 2024-09-19 LAB — GLUCOSE, CAPILLARY: Glucose-Capillary: 126 mg/dL — ABNORMAL HIGH (ref 70–99)

## 2024-09-19 SURGERY — PHACOEMULSIFICATION, CATARACT, WITH IOL INSERTION
Anesthesia: Monitor Anesthesia Care | Laterality: Right

## 2024-09-19 MED ORDER — SIGHTPATH DOSE#1 BSS IO SOLN
INTRAOCULAR | Status: DC | PRN
  Administered 2024-09-19: 100 mL via OPHTHALMIC

## 2024-09-19 MED ORDER — CYCLOPENTOLATE HCL 2 % OP SOLN
1.0000 [drp] | OPHTHALMIC | Status: AC
  Administered 2024-09-19 (×3): 1 [drp] via OPHTHALMIC

## 2024-09-19 MED ORDER — LACTATED RINGERS IV SOLN: INTRAVENOUS | Status: DC

## 2024-09-19 MED ORDER — TETRACAINE HCL 0.5 % OP SOLN
OPHTHALMIC | Status: AC
  Filled 2024-09-19: qty 4

## 2024-09-19 MED ORDER — CYCLOPENTOLATE HCL 2 % OP SOLN
OPHTHALMIC | Status: AC
  Filled 2024-09-19: qty 2

## 2024-09-19 MED ORDER — MOXIFLOXACIN HCL 0.5 % OP SOLN
OPHTHALMIC | Status: DC | PRN
  Administered 2024-09-19: .2 mL via OPHTHALMIC

## 2024-09-19 MED ORDER — LIDOCAINE HCL (PF) 2 % IJ SOLN
INTRAOCULAR | Status: DC | PRN
  Administered 2024-09-19: 4 mL via INTRAOCULAR

## 2024-09-19 MED ORDER — FENTANYL CITRATE (PF) 100 MCG/2ML IJ SOLN
INTRAMUSCULAR | Status: AC
  Filled 2024-09-19: qty 2

## 2024-09-19 MED ORDER — TETRACAINE HCL 0.5 % OP SOLN
1.0000 [drp] | OPHTHALMIC | Status: AC
  Administered 2024-09-19 (×3): 1 [drp] via OPHTHALMIC

## 2024-09-19 MED ORDER — SIGHTPATH DOSE#1 NA HYALUR & NA CHOND-NA HYALUR IO KIT
PACK | INTRAOCULAR | Status: DC | PRN
  Administered 2024-09-19: 1 via OPHTHALMIC

## 2024-09-19 MED ORDER — PHENYLEPHRINE HCL 10 % OP SOLN
1.0000 [drp] | OPHTHALMIC | Status: AC
  Administered 2024-09-19 (×3): 1 [drp] via OPHTHALMIC

## 2024-09-19 MED ORDER — DEXMEDETOMIDINE HCL IN NACL 200 MCG/50ML IV SOLN
INTRAVENOUS | Status: DC | PRN
  Administered 2024-09-19: 8 ug via INTRAVENOUS

## 2024-09-19 MED ORDER — PHENYLEPHRINE HCL 10 % OP SOLN
OPHTHALMIC | Status: AC
  Filled 2024-09-19: qty 5

## 2024-09-19 MED ORDER — FENTANYL CITRATE (PF) 100 MCG/2ML IJ SOLN
INTRAMUSCULAR | Status: DC | PRN
  Administered 2024-09-19: 50 ug via INTRAVENOUS

## 2024-09-19 MED ORDER — SIGHTPATH DOSE#1 BSS IO SOLN
INTRAOCULAR | Status: DC | PRN
  Administered 2024-09-19: 15 mL via INTRAOCULAR

## 2024-09-19 SURGICAL SUPPLY — 8 items
DISSECTOR HYDRO NUCLEUS 50X22 (MISCELLANEOUS) ×1 IMPLANT
FEE CATARACT SUITE SIGHTPATH (MISCELLANEOUS) ×1 IMPLANT
GLOVE PI ULTRA LF STRL 7.5 (GLOVE) ×1 IMPLANT
GLOVE SURG SYN 6.5 PF PI BL (GLOVE) ×1 IMPLANT
GLOVE SURG SYN 8.5 PF PI BL (GLOVE) ×1 IMPLANT
LENS IOL PANO PRO 20 5 IMPLANT
NDL FILTER BLUNT 18X1 1/2 (NEEDLE) ×1 IMPLANT
SYR 3ML LL SCALE MARK (SYRINGE) ×1 IMPLANT

## 2024-09-19 NOTE — Anesthesia Postprocedure Evaluation (Signed)
 Anesthesia Post Note  Patient: Lonnie Clarke  Procedure(s) Performed: PHACOEMULSIFICATION, CATARACT, WITH IOL INSERTION 20.50 01:33.3 (Right)  Patient location during evaluation: PACU Anesthesia Type: MAC Level of consciousness: awake and alert Pain management: pain level controlled Vital Signs Assessment: post-procedure vital signs reviewed and stable Respiratory status: spontaneous breathing, nonlabored ventilation, respiratory function stable and patient connected to nasal cannula oxygen Cardiovascular status: stable and blood pressure returned to baseline Postop Assessment: no apparent nausea or vomiting Anesthetic complications: no   No notable events documented.   Last Vitals:  Vitals:   09/19/24 0855 09/19/24 0859  BP: 91/60 (!) 100/55  Pulse: (!) 57 (!) 57  Resp: 12 (!) 21  Temp: (!) 36.2 C (!) 36.2 C  SpO2: 93% 93%    Last Pain:  Vitals:   09/19/24 0859  TempSrc:   PainSc: 0-No pain                 Prentice Murphy

## 2024-09-19 NOTE — Transfer of Care (Signed)
 Immediate Anesthesia Transfer of Care Note  Patient: Lonnie Clarke  Procedure(s) Performed: PHACOEMULSIFICATION, CATARACT, WITH IOL INSERTION 20.50 01:33.3 (Right)  Patient Location: PACU  Anesthesia Type: MAC  Level of Consciousness: awake, alert  and patient cooperative  Airway and Oxygen Therapy: Patient Spontanous Breathing   Post-op Assessment: Post-op Vital signs reviewed, Patient's Cardiovascular Status Stable, Respiratory Function Stable, Patent Airway and No signs of Nausea or vomiting  Post-op Vital Signs: Reviewed and stable  Complications: No notable events documented.

## 2024-09-19 NOTE — Op Note (Signed)
 OPERATIVE NOTE  Lonnie Clarke [id number] 09/19/2024   PREOPERATIVE DIAGNOSIS:  Nuclear sclerotic cataract right eye.  H25.11   POSTOPERATIVE DIAGNOSIS:    Nuclear sclerotic cataract right eye.     PROCEDURE:  Phacoemusification with posterior chamber intraocular lens placement of the right eye   LENS:   Implant Name Type Inv. Item Serial No. Manufacturer Lot No. LRB No. Used Action  [place] PanOptix Pro IOL 20.5 Intraocular Lens  [id number] SIGHTPATH  Right 1 Implanted       Procedure(s): PHACOEMULSIFICATION, CATARACT, WITH IOL INSERTION 20.50 01:33.3 (Right)  SURGEON:  Adine Novak, MD, MPH  ANESTHESIOLOGIST: Anesthesiologist: Dario Barter, MD CRNA: Veronica Alm BROCKS, CRNA   ANESTHESIA:  Topical with tetracaine  drops augmented with 1% preservative-free intracameral lidocaine .  ESTIMATED BLOOD LOSS: less than 1 mL.   COMPLICATIONS:  None.   DESCRIPTION OF PROCEDURE:  The patient was identified in the holding room and transported to the operating room and placed in the supine position under the operating microscope.  The right eye was identified as the operative eye and it was prepped and draped in the usual sterile ophthalmic fashion.   A 1.0 millimeter clear-corneal paracentesis was made at the 10:30 position. 0.5 ml of preservative-free 1% lidocaine  with epinephrine  was injected into the anterior chamber.  The anterior chamber was filled with viscoelastic.  A 2.4 millimeter keratome was used to make a near-clear corneal incision at the 8:00 position.  A curvilinear capsulorrhexis was made with a cystotome and capsulorrhexis forceps.  Balanced salt solution was used to hydrodissect and hydrodelineate the nucleus.   Phacoemulsification was then used in stop and chop fashion to remove the lens nucleus and epinucleus.  The remaining cortex was then removed using the irrigation and aspiration handpiece. Viscoelastic was then placed into the capsular bag to distend it for lens  placement.  A lens was then injected into the capsular bag.  The remaining viscoelastic was aspirated.   Wounds were hydrated with balanced salt solution.  The anterior chamber was inflated to a physiologic pressure with balanced salt solution.   Intracameral vigamox  0.1 mL undiluted was injected into the eye and a drop placed onto the ocular surface.  No wound leaks were noted.  The patient was taken to the recovery room in stable condition without complications of anesthesia or surgery  Adine Novak 09/19/2024, 8:52 AM

## 2024-09-19 NOTE — H&P (Signed)
 [facility]   Primary Care Physician:  Steva Clotilda DEL, NP Ophthalmologist: Dr. Adine Novak  Pre-Procedure History & Physical: HPI:  Lonnie Clarke is a 83 y.o. male here for cataract surgery.   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   History of kidney stones    Hyperlipidemia    Hypertension    Hyperthyroidism    Hypothyroidism    Sensorineural hearing loss, bilateral     Past Surgical History:  Procedure Laterality Date   EXCISION MASS HEAD Left 10/31/2022   Procedure: EXCISION AURICULAR MASS;  Surgeon: Edda Mt, MD;  Location: [facility];  Service: ENT;  Laterality: Left;   EXCISION MASS HEAD Right 06/02/2024   Procedure: EXCISION, MASS, HEAD;  Surgeon: Edda Mt, MD;  Location: [facility];  Service: ENT;  Laterality: Right;  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 Authorizing Provider  ALPRAZolam (XANAX) 0.25 MG tablet Take 0.25 mg by mouth at bedtime as needed for anxiety.   Yes [provider]  aspirin 81 MG chewable tablet Chew 81 mg by mouth daily.   Yes [provider]  cyanocobalamin  (VITAMIN B12) 1000 MCG tablet Take 1,000 mcg by mouth daily.   Yes [provider]  glimepiride (AMARYL) 2 MG tablet  Take 2 mg by mouth daily with breakfast.   Yes [provider]  hydrochlorothiazide (HYDRODIURIL) 12.5 MG tablet Take 12.5 mg by mouth daily.  11/23/15  Yes [provider]  levothyroxine (SYNTHROID) 75 MCG tablet Take 75 mcg by mouth daily before breakfast.   Yes [provider]  lisinopril (ZESTRIL) 20 MG tablet Take by mouth. 08/15/22  Yes [provider]  rosuvastatin (CRESTOR) 20 MG tablet Take by mouth. 08/19/22 09/12/24 Yes [provider]    Allergies as of 09/01/2024 - Review Complete 06/09/2024  Allergen Reaction Noted   Metformin and related  10/27/2022   Ramipril Nausea Only 12/10/2015    Family History  Problem Relation Age of Onset   Prostate cancer Neg Hx    Bladder Cancer Neg Hx     Social History   Socioeconomic History   Marital status: Widowed    Spouse name: Not on file   Number of children: Not on file   Years of education: Not on file   Highest education level: Not on file  Occupational History   Not on file  Tobacco Use   Smoking status: Former    Current packs/day: 0.00    Types: Cigarettes    Quit date: 12/10/1999    Years since quitting: 24.7   Smokeless tobacco: Former    Types: [place]    Quit date: 2018  Vaping Use   Vaping  status: Never Used  Substance and Sexual Activity   Alcohol  use: Yes    Alcohol /week: 2.0 standard drinks of alcohol     Types: 2 Standard drinks or equivalent per week    Comment: 2 drinks per night   Drug use: No   Sexual activity: Not on file  Other Topics Concern   Not on file  Social History Narrative   Not on file   Social Drivers of Health   Financial Resource Strain: Low Risk  (06/10/2024)   Received from [facility]   Overall Financial Resource Strain (CARDIA)    Difficulty of Paying Living Expenses: Not hard at all  Food Insecurity: No Food Insecurity (06/10/2024)   Received from [facility]   Hunger Vital Sign    Within the past  12 months, you worried that your food would run out before you got the money to buy more.: Never true    Within the past 12 months, the food you bought just didn't last and you didn't have money to get more.: Never true  Transportation Needs: No Transportation Needs (06/10/2024)   Received from [facility] - Transportation    In the past 12 months, has lack of transportation kept you from medical appointments or from getting medications?: No    Lack of Transportation (Non-Medical): No  Physical Activity: Sufficiently Active (06/10/2024)   Received from [facility]   Exercise Vital Sign    On average, how many days per week do you engage in moderate to strenuous exercise (like a brisk walk)?: 7 days    On average, how many minutes do you engage in exercise at this level?: 30 min  Stress: No Stress Concern Present (06/10/2024)   Received from [facility] - Occupational Stress Questionnaire    Feeling of Stress : Only a little  Social Connections: Socially Isolated (06/10/2024)   Received from [facility]   Social Connection and Isolation Panel    In a typical week, how many times do you talk on the phone with family, friends, or neighbors?: More than three times a week    How often do you get together with friends or relatives?: Twice a week    How often do you attend church or religious services?: Never    Do you belong to any clubs or organizations such as church groups, unions, fraternal or athletic groups, or school groups?: No    How often do you attend meetings of the clubs or organizations you belong to?: Never    Are you married, widowed, divorced, separated, never married, or living with a partner?: Widowed  Intimate Partner Violence: Not on file    Review of Systems: See HPI, otherwise negative ROS  Physical Exam: BP 137/74   Pulse 82   Temp 98.4 F (36.9  C) (Temporal)   Resp 10   Ht 6' 1 (1.854 m)   Wt 90.9 kg   SpO2 93%   BMI 26.44 kg/m  General:   Alert, cooperative. Head:  Normocephalic and atraumatic. Respiratory:  Normal work of breathing. Cardiovascular:  NAD  Impression/Plan: Lonnie Clarke is here for cataract surgery.  Risks, benefits, limitations, and alternatives regarding cataract surgery have been reviewed with the patient.  Questions have been answered.  All parties agreeable.   Adine Novak, MD  09/19/2024, 8:23 AM

## 2024-09-19 NOTE — Anesthesia Preprocedure Evaluation (Signed)
 Anesthesia Evaluation  Patient identified by MRN, date of birth, ID band Patient awake    Reviewed: Allergy & Precautions, NPO status , Patient's Chart, lab work & pertinent test results  History of Anesthesia Complications Negative for: history of anesthetic complications  Airway Mallampati: III  TM Distance: >3 FB Neck ROM: full    Dental  (+) Chipped   Pulmonary neg pulmonary ROS, former smoker   Pulmonary exam normal        Cardiovascular hypertension, (-) angina + Peripheral Vascular Disease  (-) Past MI and (-) Cardiac Stents Normal cardiovascular exam(-) dysrhythmias (-) Valvular Problems/Murmurs     Neuro/Psych neg Seizures PSYCHIATRIC DISORDERS Anxiety Depression    CVA, No Residual Symptoms    GI/Hepatic negative GI ROS, Neg liver ROS,,,  Endo/Other  diabetes, Well ControlledHypothyroidism    Renal/GU      Musculoskeletal   Abdominal   Peds  Hematology negative hematology ROS (+)   Anesthesia Other Findings Past Medical History: No date: Anxiety No date: Cancer [facility])     Comment:  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 No date: Diabetes mellitus without complication (HCC)     Comment:  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 No date: History of kidney stones No date: Hyperlipidemia No date: Hypertension No date: Hyperthyroidism  Past Surgical History: 10/31/2022: EXCISION MASS HEAD; Left     Comment:  Procedure: EXCISION AURICULAR MASS;  Surgeon: Edda Mt, MD;  Location: [facility];  Service: ENT;               Laterality: Left; 06/02/2024: EXCISION MASS HEAD; Right     Comment:  Procedure: EXCISION, MASS, HEAD;  Surgeon: Edda Mt, MD;  Location: [facility];  Service: ENT;               Laterality: Right;  exc basal cell carcinoma right nasal               ala (1 cm) 10/14/2011: HERNIA REPAIR 05/08/2020: HIP ARTHROPLASTY; Left     Comment:  Procedure:  ARTHROPLASTY BIPOLAR HIP (HEMIARTHROPLASTY);               Surgeon: Kathlynn Sharper, MD;  Location: [facility] ORS;                Service: Orthopedics;  Laterality: Left; No date: MANDIBLE FRACTURE SURGERY; N/A  BMI    Body Mass Index: 26.66 kg/m      Reproductive/Obstetrics negative OB ROS                              Anesthesia Physical Anesthesia Plan  ASA: 2  Anesthesia Plan: MAC   Post-op Pain Management:    Induction: Intravenous  PONV Risk Score and Plan: 2 and Midazolam and Treatment may vary due to age or medical condition  Airway Management Planned: Natural Airway and Nasal Cannula  Additional Equipment:   Intra-op Plan:   Post-operative Plan:   Informed Consent: I have reviewed the patients History and Physical, chart, labs and discussed the procedure including the risks, benefits and alternatives for the proposed anesthesia with the patient or authorized representative who has indicated his/her understanding and acceptance.     Dental Advisory Given  Plan Discussed with: Anesthesiologist, CRNA and Surgeon  Anesthesia Plan  Comments: (Patient consented for risks of anesthesia including but not limited to:  - adverse reactions to medications - damage to eyes, teeth, lips or other oral mucosa - nerve damage due to positioning  - sore throat or hoarseness - Damage to heart, brain, nerves, lungs, other parts of body or loss of life  Patient voiced understanding and assent.)         Anesthesia Quick Evaluation

## 2024-09-22 ENCOUNTER — Encounter: Payer: Self-pay | Admitting: Ophthalmology

## 2024-09-29 NOTE — Discharge Instructions (Signed)

## 2024-09-29 NOTE — Anesthesia Preprocedure Evaluation (Addendum)
 Anesthesia Evaluation    Airway Mallampati: III  TM Distance: >3 FB     Dental   Pulmonary former smoker          Cardiovascular hypertension,   05-08-20 echo 1. Left ventricular ejection fraction, by estimation, is 60 to 65%. The  left ventricle has normal function. The left ventricle has no regional  wall motion abnormalities. Left ventricular diastolic parameters are  indeterminate.   2. Right ventricular systolic function is normal. The right ventricular  size is normal.   3. Challenging images      Neuro/Psych    GI/Hepatic   Endo/Other  diabetes    Renal/GU      Musculoskeletal   Abdominal   Peds  Hematology   Anesthesia Other Findings Previous cataract 09-19-24 Dr. Dario Medical History  Hypertension  Hyperlipidemia Diabetes mellitus without complication   History of kidney stones Hyperthyroidism  Cancer (HCC) Anxiety  Diabetes (HCC) Depression  Sensorineural hearing loss, bilateral Hypothyroidism       Reproductive/Obstetrics                              Anesthesia Physical Anesthesia Plan  ASA: 2  Anesthesia Plan: MAC   Post-op Pain Management:    Induction: Intravenous  PONV Risk Score and Plan:   Airway Management Planned: Natural Airway and Nasal Cannula  Additional Equipment:   Intra-op Plan:   Post-operative Plan:   Informed Consent: I have reviewed the patients History and Physical, chart, labs and discussed the procedure including the risks, benefits and alternatives for the proposed anesthesia with the patient or authorized representative who has indicated his/her understanding and acceptance.     Dental Advisory Given  Plan Discussed with: Anesthesiologist, CRNA and Surgeon  Anesthesia Plan Comments: (Patient consented for risks of anesthesia including but not limited to:  - adverse reactions to medications - damage to eyes, teeth, lips  or other oral mucosa - nerve damage due to positioning  - sore throat or hoarseness - Damage to heart, brain, nerves, lungs, other parts of body or loss of life  Patient voiced understanding and assent.)         Anesthesia Quick Evaluation

## 2024-10-03 ENCOUNTER — Ambulatory Visit: Payer: Self-pay | Admitting: Anesthesiology

## 2024-10-03 ENCOUNTER — Encounter: Admission: RE | Disposition: A | Payer: Self-pay | Source: Home / Self Care | Attending: Ophthalmology

## 2024-10-03 ENCOUNTER — Ambulatory Visit
Admission: RE | Admit: 2024-10-03 | Discharge: 2024-10-03 | Disposition: A | Discharge status: Home / Self Care | Attending: Ophthalmology | Admitting: Ophthalmology

## 2024-10-03 ENCOUNTER — Other Ambulatory Visit: Payer: Self-pay

## 2024-10-03 ENCOUNTER — Encounter: Payer: Self-pay | Admitting: Ophthalmology

## 2024-10-03 DIAGNOSIS — Z87442 Personal history of urinary calculi: Secondary | ICD-10-CM | POA: Insufficient documentation

## 2024-10-03 DIAGNOSIS — Z9841 Cataract extraction status, right eye: Secondary | ICD-10-CM | POA: Insufficient documentation

## 2024-10-03 DIAGNOSIS — H2512 Age-related nuclear cataract, left eye: Secondary | ICD-10-CM | POA: Insufficient documentation

## 2024-10-03 DIAGNOSIS — Z961 Presence of intraocular lens: Secondary | ICD-10-CM | POA: Insufficient documentation

## 2024-10-03 DIAGNOSIS — E785 Hyperlipidemia, unspecified: Secondary | ICD-10-CM | POA: Diagnosis not present

## 2024-10-03 DIAGNOSIS — E1136 Type 2 diabetes mellitus with diabetic cataract: Secondary | ICD-10-CM | POA: Diagnosis not present

## 2024-10-03 DIAGNOSIS — I10 Essential (primary) hypertension: Secondary | ICD-10-CM | POA: Insufficient documentation

## 2024-10-03 DIAGNOSIS — E039 Hypothyroidism, unspecified: Secondary | ICD-10-CM | POA: Insufficient documentation

## 2024-10-03 DIAGNOSIS — E0590 Thyrotoxicosis, unspecified without thyrotoxic crisis or storm: Secondary | ICD-10-CM | POA: Insufficient documentation

## 2024-10-03 DIAGNOSIS — Z87891 Personal history of nicotine dependence: Secondary | ICD-10-CM | POA: Insufficient documentation

## 2024-10-03 HISTORY — PX: CATARACT EXTRACTION W/PHACO: SHX586

## 2024-10-03 LAB — GLUCOSE, CAPILLARY: Glucose-Capillary: 110 mg/dL — ABNORMAL HIGH (ref 70–99)

## 2024-10-03 SURGERY — PHACOEMULSIFICATION, CATARACT, WITH IOL INSERTION
Anesthesia: Monitor Anesthesia Care | Site: Eye | Laterality: Left

## 2024-10-03 MED ORDER — MOXIFLOXACIN HCL 0.5 % OP SOLN
OPHTHALMIC | Status: DC | PRN
  Administered 2024-10-03: .2 mL via OPHTHALMIC

## 2024-10-03 MED ORDER — LIDOCAINE HCL (PF) 2 % IJ SOLN
INTRAOCULAR | Status: DC | PRN
  Administered 2024-10-03: 4 mL via INTRAOCULAR

## 2024-10-03 MED ORDER — TETRACAINE HCL 0.5 % OP SOLN
1.0000 [drp] | OPHTHALMIC | Status: AC | PRN
  Administered 2024-10-03 (×3): 1 [drp] via OPHTHALMIC

## 2024-10-03 MED ORDER — DEXMEDETOMIDINE HCL IN NACL 80 MCG/20ML IV SOLN
INTRAVENOUS | Status: AC
  Filled 2024-10-03: qty 20

## 2024-10-03 MED ORDER — FENTANYL CITRATE (PF) 100 MCG/2ML IJ SOLN
INTRAMUSCULAR | Status: AC
  Filled 2024-10-03: qty 2

## 2024-10-03 MED ORDER — PHENYLEPHRINE HCL 10 % OP SOLN
OPHTHALMIC | Status: AC
  Filled 2024-10-03: qty 5

## 2024-10-03 MED ORDER — SIGHTPATH DOSE#1 BSS IO SOLN
INTRAOCULAR | Status: DC | PRN
  Administered 2024-10-03: 15 mL via INTRAOCULAR

## 2024-10-03 MED ORDER — SIGHTPATH DOSE#1 BSS IO SOLN
INTRAOCULAR | Status: DC | PRN
  Administered 2024-10-03: 114 mL via OPHTHALMIC

## 2024-10-03 MED ORDER — TETRACAINE HCL 0.5 % OP SOLN
OPHTHALMIC | Status: AC
  Filled 2024-10-03: qty 4

## 2024-10-03 MED ORDER — PHENYLEPHRINE HCL 10 % OP SOLN
1.0000 [drp] | OPHTHALMIC | Status: AC | PRN
  Administered 2024-10-03 (×3): 1 [drp] via OPHTHALMIC

## 2024-10-03 MED ORDER — CYCLOPENTOLATE HCL 2 % OP SOLN
OPHTHALMIC | Status: AC
  Filled 2024-10-03: qty 2

## 2024-10-03 MED ORDER — SIGHTPATH DOSE#1 NA HYALUR & NA CHOND-NA HYALUR IO KIT
PACK | INTRAOCULAR | Status: DC | PRN
  Administered 2024-10-03: 1 via OPHTHALMIC

## 2024-10-03 MED ORDER — CYCLOPENTOLATE HCL 2 % OP SOLN
1.0000 [drp] | OPHTHALMIC | Status: AC | PRN
  Administered 2024-10-03 (×3): 1 [drp] via OPHTHALMIC

## 2024-10-03 MED ORDER — FENTANYL CITRATE (PF) 100 MCG/2ML IJ SOLN
INTRAMUSCULAR | Status: DC | PRN
  Administered 2024-10-03: 50 ug via INTRAVENOUS

## 2024-10-03 MED ORDER — LACTATED RINGERS IV SOLN: INTRAVENOUS | Status: DC

## 2024-10-03 SURGICAL SUPPLY — 9 items
DISSECTOR HYDRO NUCLEUS 50X22 (MISCELLANEOUS) ×1 IMPLANT
FEE CATARACT SUITE SIGHTPATH (MISCELLANEOUS) ×1 IMPLANT
GLOVE PI ULTRA LF STRL 7.5 (GLOVE) ×1 IMPLANT
GLOVE SURG SYN 6.5 PF PI BL (GLOVE) ×1 IMPLANT
GLOVE SURG SYN 8.5 PF PI BL (GLOVE) ×1 IMPLANT
LENS IOL PANO PRO 20.0 IMPLANT
NDL FILTER BLUNT 18X1 1/2 (NEEDLE) ×1 IMPLANT
NEEDLE FILTER BLUNT 18X1 1/2 (NEEDLE) ×1 IMPLANT
SYR 3ML LL SCALE MARK (SYRINGE) ×1 IMPLANT

## 2024-10-03 NOTE — Anesthesia Postprocedure Evaluation (Signed)
"   Anesthesia Post Note  Patient: Lonnie Clarke  Procedure(s) Performed: PHACOEMULSIFICATION, CATARACT, WITH IOL INSERTION 13.94 01:06.9 (Left: Eye)  Patient location during evaluation: PACU Anesthesia Type: MAC Level of consciousness: awake and alert Pain management: pain level controlled Vital Signs Assessment: post-procedure vital signs reviewed and stable Respiratory status: spontaneous breathing, nonlabored ventilation, respiratory function stable and patient connected to nasal cannula oxygen Cardiovascular status: stable and blood pressure returned to baseline Postop Assessment: no apparent nausea or vomiting Anesthetic complications: no   No notable events documented.   Last Vitals:  Vitals:   10/03/24 0857 10/03/24 0900  BP: 117/63 121/60  Pulse: (!) 50 (!) 58  Resp: 11 14  Temp: (!) 36.1 C (!) 36.1 C  SpO2: 95% 94%    Last Pain:  Vitals:   10/03/24 0900  TempSrc:   PainSc: 0-No pain                 Ambrosio Reuter C Janina Trafton      "

## 2024-10-03 NOTE — Op Note (Signed)
 The wrong note type was entered.  So a new note was entered and this note was deleted.  Bmk.

## 2024-10-03 NOTE — H&P (Signed)
 [facility]   Primary Care Physician:  Steva Clotilda DEL, NP Ophthalmologist: Dr. Adine Novak  Pre-Procedure History & Physical: HPI:  Lonnie Clarke is a 83 y.o. male here for cataract surgery.   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   History of kidney stones    Hyperlipidemia    Hypertension    Hyperthyroidism    Hypothyroidism    Sensorineural hearing loss, bilateral     Past Surgical History:  Procedure Laterality Date   CATARACT EXTRACTION W/PHACO Right 09/19/2024   Procedure: PHACOEMULSIFICATION, CATARACT, WITH IOL INSERTION 20.50 01:33.3;  Surgeon: Novak Adine Anes, MD;  Location: [facility];  Service: Ophthalmology;  Laterality: Right;   EXCISION MASS HEAD Left 10/31/2022   Procedure: EXCISION AURICULAR MASS;  Surgeon: Edda Mt, MD;  Location: [facility];  Service: ENT;  Laterality: Left;   EXCISION MASS HEAD Right 06/02/2024   Procedure: EXCISION, MASS, HEAD;  Surgeon: Edda Mt, MD;  Location: [facility];  Service: ENT;  Laterality: Right;  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 Authorizing Provider  ALPRAZolam (XANAX) 0.25 MG tablet Take 0.25 mg by mouth at bedtime as needed for anxiety.   Yes [provider]  aspirin 81 MG chewable tablet Chew 81 mg by mouth daily.   Yes [provider]  cyanocobalamin  (VITAMIN B12) 1000 MCG tablet Take 1,000 mcg by mouth daily.   Yes [provider]  glimepiride (AMARYL) 2 MG tablet Take 2 mg by mouth daily with breakfast.   Yes [provider]  hydrochlorothiazide (HYDRODIURIL) 12.5 MG tablet Take 12.5 mg by mouth daily.  11/23/15  Yes [provider]  levothyroxine (SYNTHROID) 75 MCG tablet Take 75 mcg by mouth daily before breakfast.   Yes [provider]  lisinopril (ZESTRIL) 20 MG tablet Take by mouth. 08/15/22  Yes [provider]  rosuvastatin (CRESTOR) 20 MG tablet Take by mouth. 08/19/22 09/12/24  [provider]    Allergies as of 09/01/2024 - Review Complete 06/09/2024  Allergen Reaction Noted   Metformin and related  10/27/2022   Ramipril Nausea Only 12/10/2015    Family History  Problem Relation Age of Onset   Prostate cancer Neg Hx    Bladder Cancer Neg Hx     Social History   Socioeconomic History   Marital status: Widowed    Spouse name: Not on file   Number of children: Not on file   Years of education: Not on file   Highest education level: Not on file  Occupational History   Not on file  Tobacco Use   Smoking status: Former    Current packs/day: 0.00    Types: Cigarettes  Quit date: 12/10/1999    Years since quitting: 24.8   Smokeless tobacco: Former    Types: Chew    Quit date: 2018  Vaping Use   Vaping status: Never Used  Substance and Sexual Activity   Alcohol  use: Yes    Alcohol /week: 2.0 standard drinks of alcohol     Types: 2 Standard drinks or equivalent per week    Comment: 2 drinks per night   Drug use: No   Sexual activity: Not on file  Other Topics Concern   Not on file  Social History Narrative   Not on file   Social Drivers of Health   Tobacco Use: Medium Risk (10/03/2024)   Patient History    Smoking Tobacco Use: Former    Smokeless  Tobacco Use: Former    Passive Exposure: Not on [place]uary Strain: Low Risk  (06/10/2024)   Received from [facility]   Overall Financial Resource Strain (CARDIA)    Difficulty of Paying Living Expenses: Not hard at all  Food Insecurity: No Food Insecurity (06/10/2024)   Received from [facility]   Epic    Within the past 12 months, you worried that your food would run out before you got the money to buy more.: Never true    Within the past 12 months, the food you bought just didn't last and you didn't have money to get more.: Never true  Transportation Needs: No Transportation Needs (06/10/2024)   Received from [facility] - Transportation    In the past 12 months, has lack of transportation kept you from medical appointments or from getting medications?: No    Lack of Transportation (Non-Medical): No  Physical Activity: Sufficiently Active (06/10/2024)   Received from [facility]   Exercise Vital Sign    On average, how many days per week do you engage in moderate to strenuous exercise (like a brisk walk)?: 7 days    On average, how many minutes do you engage in exercise at this level?: 30 min  Stress: No Stress Concern Present (06/10/2024)   Received from [facility] - Occupational Stress Questionnaire    Feeling of Stress : Only a little  Social Connections: Socially Isolated (06/10/2024)   Received from [facility]   Social Connection and Isolation Panel    In a typical week, how many times do you talk on the phone with family, friends, or neighbors?: More than three times a week    How often do you get together with friends or relatives?: Twice a week    How often do you attend church or religious services?: Never    Do you belong to any clubs or organizations such as church groups, unions, fraternal or athletic  groups, or school groups?: No    How often do you attend meetings of the clubs or organizations you belong to?: Never    Are you married, widowed, divorced, separated, never married, or living with a partner?: Widowed  Intimate Partner Violence: Not on file  Depression (PHQ2-9): Not on file  Alcohol  Screen: Not on file  Housing: Low Risk  (06/10/2024)   Received from [facility]   Epic    In the last 12 months, was there a time when you were not able to pay the mortgage or rent on time?: No    In the past 12  months, how many times have you moved where you were living?: 0    At any time in the past 12 months, were you homeless or living in a shelter (including now)?: No  Utilities: Not At Risk (06/10/2024)   Received from [facility]   Epic    In the past 12 months has the electric, gas, oil, or water company threatened to shut off services in your home?: No  Health Literacy: Adequate Health Literacy (06/10/2024)   Received from [facility]   B1300 Health Literacy    How often do you need to have someone help you when you read instructions, pamphlets, or other written material from your doctor or pharmacy?: Never    Review of Systems: See HPI, otherwise negative ROS  Physical Exam: BP 134/74   Pulse 75   Temp 99 F (37.2 C) (Temporal)   Resp 17   Ht 6' 1 (1.854 m)   Wt 91.3 kg   SpO2 93%   BMI 26.56 kg/m  General:   Alert, cooperative. Head:  Normocephalic and atraumatic. Respiratory:  Normal work of breathing. Cardiovascular:  NAD  Impression/Plan: Lonnie Clarke is here for cataract surgery.  Risks, benefits, limitations, and alternatives regarding cataract surgery have been reviewed with the patient.  Questions have been answered.  All parties agreeable.   Adine Novak, MD  10/03/2024, 8:26 AM

## 2024-10-03 NOTE — Transfer of Care (Signed)
 Immediate Anesthesia Transfer of Care Note  Patient: Lonnie Clarke  Procedure(s) Performed: PHACOEMULSIFICATION, CATARACT, WITH IOL INSERTION 13.94 01:06.9 (Left: Eye)  Patient Location: PACU  Anesthesia Type: MAC  Level of Consciousness: awake, alert  and patient cooperative  Airway and Oxygen Therapy: Patient Spontanous Breathing and Patient connected to supplemental oxygen  Post-op Assessment: Post-op Vital signs reviewed, Patient's Cardiovascular Status Stable, Respiratory Function Stable, Patent Airway and No signs of Nausea or vomiting  Post-op Vital Signs: Reviewed and stable  Complications: No notable events documented.

## 2024-10-04 NOTE — Op Note (Signed)
 OPERATIVE NOTE  ORDEAN FOUTS [id number] 10/04/2024   PREOPERATIVE DIAGNOSIS:  Nuclear sclerotic cataract left eye.  H25.12   POSTOPERATIVE DIAGNOSIS:    Nuclear sclerotic cataract left eye.     PROCEDURE:  Phacoemusification with posterior chamber intraocular lens placement of the left eye   LENS:   Implant Name Type Inv. Item Serial No. Manufacturer Lot No. LRB No. Used Action  LENS IOL PANO PRO 20.0 - [id number]  LENS IOL PANO PRO 20.0 [id number] [employer]  Left 1 Implanted      Procedures: PHACOEMULSIFICATION, CATARACT, WITH IOL INSERTION 13.94 01:06.9 (Left)   SURGEON:  Adine Novak, MD, MPH   ANESTHESIA:  Topical with tetracaine  drops augmented with 1% preservative-free intracameral lidocaine .  ESTIMATED BLOOD LOSS: <1 mL   COMPLICATIONS:  None.   DESCRIPTION OF PROCEDURE:  The patient was identified in the holding room and transported to the operating room and placed in the supine position under the operating microscope.  The left eye was identified as the operative eye and it was prepped and draped in the usual sterile ophthalmic fashion.   A 1.0 millimeter clear-corneal paracentesis was made at the 5:00 position. 0.5 ml of preservative-free 1% lidocaine  with epinephrine  was injected into the anterior chamber.  The anterior chamber was filled with viscoelastic.  A 2.4 millimeter keratome was used to make a near-clear corneal incision at the 2:00 position.  A curvilinear capsulorrhexis was made with a cystotome and capsulorrhexis forceps.  Balanced salt solution was used to hydrodissect and hydrodelineate the nucleus.   Phacoemulsification was then used in stop and chop fashion to remove the lens nucleus and epinucleus.  The remaining cortex was then removed using the irrigation and aspiration handpiece. Viscoelastic was then placed into the capsular bag to distend it for lens placement.  A lens was then injected into the capsular bag.  The remaining viscoelastic was  aspirated.   Wounds were hydrated with balanced salt solution.  The anterior chamber was inflated to a physiologic pressure with balanced salt solution.  Intracameral vigamox  0.1 mL undiltued was injected into the eye and a drop placed onto the ocular surface.  No wound leaks were noted.  The patient was taken to the recovery room in stable condition without complications of anesthesia or surgery  Adine Novak 10/04/2024, 8:37 AM
